# Patient Record
Sex: Female | Born: 1990
Health system: Southern US, Community
[De-identification: ages and names within clinical notes are randomized; demographics above are authoritative.]

## PROBLEM LIST (undated history)

## (undated) DIAGNOSIS — J45909 Unspecified asthma, uncomplicated: Secondary | ICD-10-CM

## (undated) DIAGNOSIS — Z8489 Family history of other specified conditions: Secondary | ICD-10-CM

## (undated) DIAGNOSIS — D649 Anemia, unspecified: Secondary | ICD-10-CM

## (undated) DIAGNOSIS — N390 Urinary tract infection, site not specified: Secondary | ICD-10-CM

## (undated) HISTORY — DX: Urinary tract infection, site not specified: N39.0

## (undated) HISTORY — PX: OTHER SURGICAL HISTORY: SHX169

## (undated) HISTORY — DX: Anemia, unspecified: D64.9

## (undated) HISTORY — DX: Unspecified asthma, uncomplicated: J45.909

---

## 2013-04-10 ENCOUNTER — Emergency Department (HOSPITAL_COMMUNITY)
Admission: EM | Admit: 2013-04-10 | Discharge: 2013-04-10 | Disposition: A | Discharge status: Home / Self Care | Payer: Self-pay | Attending: Emergency Medicine | Admitting: Emergency Medicine

## 2013-04-10 ENCOUNTER — Encounter (HOSPITAL_COMMUNITY): Payer: Self-pay | Admitting: Emergency Medicine

## 2013-04-10 DIAGNOSIS — T5791XA Toxic effect of unspecified inorganic substance, accidental (unintentional), initial encounter: Secondary | ICD-10-CM

## 2013-04-10 DIAGNOSIS — L272 Dermatitis due to ingested food: Secondary | ICD-10-CM | POA: Insufficient documentation

## 2013-04-10 NOTE — ED Provider Notes (Signed)
CSN: [id number]     Arrival date & time 04/10/13  1327 History  This chart was scribed for non-physician practitioner Antony Madura, PA-C working with Dagmar Hait, MD by Clydene Laming, ED Scribe. This patient was seen in room WTR7/WTR7 and the patient's care was started at 3:42 PM.   Chief Complaint  Patient presents with  . Allergic Reaction    The history is provided by the patient. No language interpreter was used.   HPI Comments: Rose Berry is a 22 y.o. female who presents to the Emergency Department complaining of an allergic reaction. Pt states she ate at [facility] on Saturday, 2 days ago, and haflway through the food she turned it over and found it was covered in mold. Pt reports associated pruritic rash the following day that has resolved spontaneously. She denies fever, swelling of the tongue, sob, inability to swallow, dysphagia, or drooling. Pt has barely eaten since Saturday but has tolerated water.   History reviewed. No pertinent past medical history. History reviewed. No pertinent past surgical history. No family history on file. History  Substance Use Topics  . Smoking status: Never Smoker   . Smokeless tobacco: Not on file  . Alcohol Use: Not on file   OB History   Grav Para Term Preterm Abortions TAB SAB Ect Mult Living                 Review of Systems  Constitutional: Negative for fever and chills.  HENT: Negative for drooling, facial swelling and trouble swallowing.   Skin: Positive for rash (reolved).  All other systems reviewed and are negative.    Allergies  Review of patient's allergies indicates no known allergies.  Home Medications  No current outpatient prescriptions on file. Triage Vitals:BP 106/83  Pulse 84  Temp(Src) 97.8 F (36.6 C) (Oral)  Resp 16  SpO2 100% Physical Exam  Nursing note and vitals reviewed. Constitutional: She is oriented to person, place, and time. She appears well-developed and well-nourished. No distress.   HENT:  Head: Normocephalic and atraumatic.  Mouth/Throat: Oropharynx is clear and moist. No oropharyngeal exudate.  Uvula midline and airway patent. Patient tolerating secretions without difficulty.  Eyes: Conjunctivae and EOM are normal. Pupils are equal, round, and reactive to light. No scleral icterus.  Neck: Normal range of motion. Neck supple.  Cardiovascular: Normal rate, regular rhythm and normal heart sounds.   Pulmonary/Chest: Effort normal and breath sounds normal. No stridor. No respiratory distress. She has no wheezes. She has no rales.  Musculoskeletal: Normal range of motion.  Neurological: She is alert and oriented to person, place, and time.  Skin: Skin is warm and dry. No rash noted. She is not diaphoretic. No erythema. No pallor.  Psychiatric: She has a normal mood and affect. Her behavior is normal.    ED Course  Procedures (including critical care time) DIAGNOSTIC STUDIES: Oxygen Saturation is 100% on RA, normal by my interpretation.    COORDINATION OF CARE: 3:50 PM- Discussed treatment plan with pt at bedside. Pt verbalized understanding and agreement with plan.   Labs Review Labs Reviewed - No data to display Imaging Review No results found.  EKG Interpretation   None       MDM   1. Ingestion of substance, initial encounter    Patient is an otherwise healthy 22 y.o female who presents after ingesting food with mold on it 2 days ago; patient concerned for allergic reaction because she developed a fleeting pruritic rash that has since  resolved without any intervention. Patient is well and nontoxic appearing, hemodynamically stable, and afebrile. She is tolerating secretions without difficulty and airway is patent; no stridor. No angioedema, shortness of breath or rashes appreciated. Do not believe further emergent work up is warranted. She is stable for d/c with PCP follow up. Benadryl and pepcid advised as needed should patient feel as though any  allergic-type reaction persists. Return precautions discussed and patient agreeable to plan with no unaddressed concerns.  I personally performed the services described in this documentation, which was scribed in my presence. The recorded information has been reviewed and is accurate.     Antony Madura, PA-C 04/19/13 1306

## 2013-04-10 NOTE — ED Notes (Signed)
Pt states she thinks she may have eaten mold on Saturday night; states started breaking out yesterday; itching

## 2013-04-10 NOTE — Progress Notes (Signed)
[facility] provided pt with a list of primary care resources and ACA information.  °

## 2013-04-19 NOTE — ED Provider Notes (Signed)
Medical screening examination/treatment/procedure(s) were performed by non-physician practitioner and as supervising physician I was immediately available for consultation/collaboration.  EKG Interpretation   None         William Neela Zecca, MD 04/19/13 1642 

## 2014-12-12 ENCOUNTER — Encounter: Payer: Self-pay | Admitting: Medical

## 2014-12-12 ENCOUNTER — Ambulatory Visit: Payer: Self-pay | Admitting: Medical

## 2014-12-12 ENCOUNTER — Ambulatory Visit (INDEPENDENT_AMBULATORY_CARE_PROVIDER_SITE_OTHER): Payer: Self-pay | Admitting: Medical

## 2014-12-12 ENCOUNTER — Telehealth: Payer: Self-pay | Admitting: Medical

## 2014-12-12 ENCOUNTER — Ambulatory Visit (HOSPITAL_BASED_OUTPATIENT_CLINIC_OR_DEPARTMENT_OTHER)
Admission: RE | Admit: 2014-12-12 | Discharge: 2014-12-12 | Disposition: A | Discharge status: Home / Self Care | Payer: Self-pay | Source: Ambulatory Visit | Attending: Medical | Admitting: Medical

## 2014-12-12 VITALS — BP 121/94 | HR 73 | Temp 98.9°F | Ht 62.75 in | Wt 162.2 lb

## 2014-12-12 DIAGNOSIS — R319 Hematuria, unspecified: Secondary | ICD-10-CM

## 2014-12-12 DIAGNOSIS — M549 Dorsalgia, unspecified: Secondary | ICD-10-CM

## 2014-12-12 DIAGNOSIS — N920 Excessive and frequent menstruation with regular cycle: Secondary | ICD-10-CM

## 2014-12-12 DIAGNOSIS — D649 Anemia, unspecified: Secondary | ICD-10-CM | POA: Insufficient documentation

## 2014-12-12 DIAGNOSIS — N390 Urinary tract infection, site not specified: Secondary | ICD-10-CM | POA: Insufficient documentation

## 2014-12-12 DIAGNOSIS — N3001 Acute cystitis with hematuria: Secondary | ICD-10-CM

## 2014-12-12 DIAGNOSIS — N132 Hydronephrosis with renal and ureteral calculous obstruction: Secondary | ICD-10-CM | POA: Insufficient documentation

## 2014-12-12 DIAGNOSIS — N201 Calculus of ureter: Secondary | ICD-10-CM | POA: Insufficient documentation

## 2014-12-12 LAB — CBC WITH DIFFERENTIAL/PLATELET
BASOS PCT: 0 % (ref 0–1)
Basophils Absolute: 0 10*3/uL (ref 0.0–0.1)
Eosinophils Absolute: 0.1 10*3/uL (ref 0.0–0.7)
Eosinophils Relative: 1 % (ref 0–5)
HEMATOCRIT: 31.1 % — AB (ref 36.0–46.0)
HEMOGLOBIN: 9.3 g/dL — AB (ref 12.0–15.0)
LYMPHS ABS: 2 10*3/uL (ref 0.7–4.0)
LYMPHS PCT: 29 % (ref 12–46)
MCH: 19.9 pg — AB (ref 26.0–34.0)
MCHC: 29.9 g/dL — AB (ref 30.0–36.0)
MCV: 66.6 fL — ABNORMAL LOW (ref 78.0–100.0)
MONO ABS: 0.5 10*3/uL (ref 0.1–1.0)
MONOS PCT: 7 % (ref 3–12)
MPV: 9.8 fL (ref 8.6–12.4)
NEUTROS ABS: 4.4 10*3/uL (ref 1.7–7.7)
NEUTROS PCT: 63 % (ref 43–77)
Platelets: 457 10*3/uL — ABNORMAL HIGH (ref 150–400)
RBC: 4.67 MIL/uL (ref 3.87–5.11)
RDW: 19.7 % — ABNORMAL HIGH (ref 11.5–15.5)
WBC: 7 10*3/uL (ref 4.0–10.5)

## 2014-12-12 LAB — COMPREHENSIVE METABOLIC PANEL
ALT: 10 U/L (ref 6–29)
AST: 15 U/L (ref 10–30)
Albumin: 4.3 g/dL (ref 3.6–5.1)
Alkaline Phosphatase: 53 U/L (ref 33–115)
BUN: 11 mg/dL (ref 7–25)
CALCIUM: 9.5 mg/dL (ref 8.6–10.2)
CHLORIDE: 102 mmol/L (ref 98–110)
CO2: 25 mmol/L (ref 20–31)
Creat: 1.02 mg/dL (ref 0.50–1.10)
GLUCOSE: 86 mg/dL (ref 65–99)
POTASSIUM: 4.6 mmol/L (ref 3.5–5.3)
Sodium: 137 mmol/L (ref 135–146)
Total Bilirubin: 0.5 mg/dL (ref 0.2–1.2)
Total Protein: 7.2 g/dL (ref 6.1–8.1)

## 2014-12-12 LAB — POCT URINALYSIS DIPSTICK
Bilirubin, UA: NEGATIVE
GLUCOSE UA: NEGATIVE
Ketones, UA: NEGATIVE
NITRITE UA: NEGATIVE
PROTEIN UA: NEGATIVE
RBC UA: POSITIVE
SPEC GRAV UA: 1.015
UROBILINOGEN UA: 0.2
pH, UA: 7

## 2014-12-12 LAB — POCT URINE PREGNANCY: Preg Test, Ur: NEGATIVE

## 2014-12-12 MED ORDER — KETOROLAC TROMETHAMINE 60 MG/2ML IM SOLN
60.0000 mg | Freq: Once | INTRAMUSCULAR | Status: AC
  Administered 2014-12-12: 60 mg via INTRAMUSCULAR

## 2014-12-12 MED ORDER — NITROFURANTOIN MONOHYD MACRO 100 MG PO CAPS: 100.0000 mg | ORAL_CAPSULE | Freq: Two times a day (BID) | ORAL | Status: DC

## 2014-12-12 MED ORDER — HYDROCODONE-ACETAMINOPHEN 10-325 MG PO TABS: 1.0000 | ORAL_TABLET | Freq: Four times a day (QID) | ORAL | Status: DC | PRN

## 2014-12-12 NOTE — Assessment & Plan Note (Signed)
For heavy menses which is liklely source of anemia, will refer to gyn so type of ocp can be given that you can tolerate.

## 2014-12-12 NOTE — Progress Notes (Deleted)
Subjective:     Patient ID: Rose Berry, female   DOB: 1990/10/23, 24 y.o.   MRN: [id number]  HPI   Review of Systems     Objective:   Physical Exam     Assessment:     ***    Plan:     ***

## 2014-12-12 NOTE — Assessment & Plan Note (Signed)
For possible uti associated stop keflex and start macrobid. Culture sent out today.

## 2014-12-12 NOTE — Patient Instructions (Addendum)
For anemia. Repeat cbc stat today. Continue Iron and refer to Gyn.  For your back pain and hematuria get ct renal stone protocol. Rx toradol 60 mg im. Rx hydrocodone.  For heavy menses which is liklely source of anemia, will refer to gyn so type of ocp can be given that you can tolerate.   For possible uti associated stop keflex and start macrobid. Culture sent out today.  Follow up on Monday upcoming week or as needed. But if pain or symptoms worsen during interim then ED evaluation.  [phone].  Pt wanted return to work note for tomorrow though I offered her to return on Friday.

## 2014-12-12 NOTE — Progress Notes (Signed)
Pre visit review using our clinic review tool, if applicable. No additional management support is needed unless otherwise documented below in the visit note. 

## 2014-12-12 NOTE — Progress Notes (Signed)
Subjective:    Patient ID: Rose Berry, female    DOB: 30-Oct-1990, 24 y.o.   MRN: [id number]  HPI  I have reviewed pt PMH, PSH, FH, Social History and Surgical History  Anemia- Pt states was at [facility] ED. Hb was 7.7 and hct 26.1. Pt has history of heavy menses. Pt last menstrual cycle started a week ago Monday and ended on past Friday. Pt menstrual cycle just one time a month but very heavy. Pt was given iron for her anemia. Pt states in past she tried ocp. She did not like/could not tolerate nausea and side effects from.   Pt left side back pain(can't find comfortable position). That started last Wednesday. Pain woke her out of sleep. Pt has no pain when she urinates. No dysuria. Pt has had fevers and some chills. Pt never had any kidney stones. Pt had xray of abdomen but no CT in ED per her report.. Pt given keflex for possible infection. Also given hydrocodone for pain but she is still in pain.  Asthma- hx of but under control.  Dr.Hayes former pcp.          Review of Systems  Constitutional: Positive for fever, chills and fatigue. Negative for diaphoresis.  Respiratory: Negative for cough, chest tightness, shortness of breath and wheezing.   Cardiovascular: Negative for chest pain and palpitations.  Gastrointestinal: Negative for nausea, vomiting, abdominal pain, diarrhea and constipation.  Genitourinary: Negative for dysuria, urgency, frequency, hematuria and pelvic pain.  Musculoskeletal: Positive for back pain.  Neurological: Negative for dizziness and headaches.  Hematological: Negative for adenopathy. Does not bruise/bleed easily.  Psychiatric/Behavioral: Negative for behavioral problems and confusion.   Past Medical History  Diagnosis Date  . Anemia   . Asthma   . UTI (urinary tract infection)     Social History   Social History  . Marital Status: Single    Spouse Name: N/A  . Number of Children: N/A  . Years of Education: N/A    Occupational History  . Not on file.   Social History Main Topics  . Smoking status: Never Smoker   . Smokeless tobacco: Not on file  . Alcohol Use: No  . Drug Use: No  . Sexual Activity: Not on file   Other Topics Concern  . Not on file   Social History Narrative    History reviewed. No pertinent past surgical history.  Family History  Problem Relation Age of Onset  . Arthritis Mother   . Hypertension Mother   . Arthritis Father   . Hyperlipidemia Maternal Grandmother   . Hypertension Maternal Grandmother   . Diabetes Maternal Grandmother   . Kidney disease Maternal Grandmother   . Hyperlipidemia Maternal Grandfather   . Hypertension Maternal Grandfather   . Diabetes Maternal Grandfather   . Kidney disease Maternal Grandfather   . Hyperlipidemia Paternal Grandmother   . Hypertension Paternal Grandmother   . Diabetes Paternal Grandmother   . Kidney disease Paternal Grandmother   . Hyperlipidemia Paternal Grandfather   . Hypertension Paternal Grandfather   . Diabetes Paternal Grandfather   . Kidney disease Paternal Grandfather     No Known Allergies  No current outpatient prescriptions on file prior to visit.   No current facility-administered medications on file prior to visit.    BP 121/94 mmHg  Pulse 73  Temp(Src) 98.9 F (37.2 C) (Oral)  Ht 5' 2.75" (1.594 m)  Wt 162 lb 3.2 oz (73.573 kg)  BMI 28.96 kg/m2  SpO2  100%       Objective:   Physical Exam  General Appearance- Not in acute distress but appears to be in pain grabbing left cva area and difficulty finding comfortable postion.  HEENT Eyes- Scleraeral/Conjuntiva-bilat- Not Yellow. Mouth & Throat- Normal.  Chest and Lung Exam Auscultation: Breath sounds:-Normal. Adventitious sounds:- No Adventitious sounds.  Cardiovascular Auscultation:Rythm - Regular. Heart Sounds -Normal heart sounds.  Abdomen Inspection:-Inspection Normal.  Palpation/Perucssion: Palpation and Percussion of  the abdomen reveal- Non Tender, No Rebound tenderness, No rigidity(Guarding) and No Palpable abdominal masses.  Liver:-Normal.  Spleen:- Normal.   Back- lt cva area/lower rib region/mid axillary region pain at that level.      Assessment & Plan:

## 2014-12-12 NOTE — Assessment & Plan Note (Signed)
For anemia. Repeat cbc stat today. Continue Iron and refer to Gyn.

## 2014-12-12 NOTE — Assessment & Plan Note (Signed)
For your back pain and hematuria get ct renal stone protocol. Rx toradol 60 mg im. Rx hydrocodone.

## 2014-12-13 LAB — URINE CULTURE
COLONY COUNT: NO GROWTH
ORGANISM ID, BACTERIA: NO GROWTH

## 2014-12-13 NOTE — Addendum Note (Signed)
Addended by: Anabel Halon on: 12/13/2014 08:08 AM   Modules accepted: Orders

## 2014-12-14 ENCOUNTER — Encounter: Payer: Self-pay | Admitting: Medical

## 2014-12-14 ENCOUNTER — Ambulatory Visit (INDEPENDENT_AMBULATORY_CARE_PROVIDER_SITE_OTHER): Payer: Self-pay | Admitting: Medical

## 2014-12-14 VITALS — BP 126/86 | HR 89 | Temp 98.3°F | Resp 16 | Wt 162.6 lb

## 2014-12-14 DIAGNOSIS — N200 Calculus of kidney: Secondary | ICD-10-CM

## 2014-12-14 DIAGNOSIS — D649 Anemia, unspecified: Secondary | ICD-10-CM

## 2014-12-14 MED ORDER — KETOROLAC TROMETHAMINE 60 MG/2ML IM SOLN
60.0000 mg | Freq: Once | INTRAMUSCULAR | Status: AC
  Administered 2014-12-14: 60 mg via INTRAMUSCULAR

## 2014-12-14 MED ORDER — OXYCODONE-ACETAMINOPHEN 5-325 MG PO TABS: 1.0000 | ORAL_TABLET | Freq: Three times a day (TID) | ORAL | Status: DC | PRN

## 2014-12-14 MED ORDER — KETOROLAC TROMETHAMINE 60 MG/2ML IM SOLN: 60.0000 mg | Freq: Once | INTRAMUSCULAR | Status: DC

## 2014-12-14 NOTE — Telephone Encounter (Signed)
Pt was no show 12/12/14 9:30am, new pt appt, pt came in late because she got lost, came back same day at 3:30pm, charge for no show?

## 2014-12-14 NOTE — Progress Notes (Signed)
Pre visit review using our clinic review tool, if applicable. No additional management support is needed unless otherwise documented below in the visit note. 

## 2014-12-14 NOTE — Patient Instructions (Addendum)
For your kidney stone we gave toradol 60mg  im today. Stop hydrocodone. Start oxycodone.   Use oxycodone only. If over the weekend pain not controlled or worsening symptom then ED evaluation.  Do not use hydrocodone and oxcycodone at same time. Very important advisement.  Will refer pt to urologist. Try to get her in by this coming Monday.  Follow up with [place] as needed early next week.  Pt does not want to go to urologist today since would cost $250 up front. So they opted to wait and see if stone may pass by this Monday. Urologist office will schedule her with on call urologist on Monday if needed. They will call her on Monday am. During the interim if pain worsens pt and mom agreed would go to ED.

## 2014-12-14 NOTE — Telephone Encounter (Signed)
No charge. 

## 2014-12-14 NOTE — Progress Notes (Signed)
Subjective:    Patient ID: Rose Berry, female    DOB: April 05, 1991, 24 y.o.   MRN: [id number]  HPI   4 mm left side cva/flank area pain. Pt still has pain medication. I prescribed her hydrocodone 10/325mg . She has severe level pain despite [place] of pain medication. Pt states vomited on time today  with high level pain.  Pt UC was negative.   Pt anemia level improved. She was placed on iron in the ED. When I rechecked hb/hct increased. Infection fighting cells were not elevated.  Pt states toradol Im given the other day gave her relief until yesterday. When pain returned.  Pt lmp last Monday.     Review of Systems  Constitutional: Negative for fever, chills and fatigue.  Respiratory: Negative for cough, chest tightness, shortness of breath and wheezing.   Cardiovascular: Negative for chest pain and palpitations.  Gastrointestinal: Negative for abdominal pain.  Musculoskeletal: Positive for back pain.       Back pain with lt flank pain.  Neurological: Negative for dizziness and headaches.  Hematological: Negative for adenopathy. Does not bruise/bleed easily.  Psychiatric/Behavioral: Negative for behavioral problems and confusion.    Past Medical History  Diagnosis Date  . Anemia   . Asthma   . UTI (urinary tract infection)     Social History   Social History  . Marital Status: Single    Spouse Name: N/A  . Number of Children: N/A  . Years of Education: N/A   Occupational History  . Not on file.   Social History Main Topics  . Smoking status: Never Smoker   . Smokeless tobacco: Not on file  . Alcohol Use: No  . Drug Use: No  . Sexual Activity: Not on file   Other Topics Concern  . Not on file   Social History Narrative    No past surgical history on file.  Family History  Problem Relation Age of Onset  . Arthritis Mother   . Hypertension Mother   . Arthritis Father   . Hyperlipidemia Maternal Grandmother   . Hypertension Maternal Grandmother     . Diabetes Maternal Grandmother   . Kidney disease Maternal Grandmother   . Hyperlipidemia Maternal Grandfather   . Hypertension Maternal Grandfather   . Diabetes Maternal Grandfather   . Kidney disease Maternal Grandfather   . Hyperlipidemia Paternal Grandmother   . Hypertension Paternal Grandmother   . Diabetes Paternal Grandmother   . Kidney disease Paternal Grandmother   . Hyperlipidemia Paternal Grandfather   . Hypertension Paternal Grandfather   . Diabetes Paternal Grandfather   . Kidney disease Paternal Grandfather     No Known Allergies  Current Outpatient Prescriptions on File Prior to Visit  Medication Sig Dispense Refill  . ferrous sulfate 325 (65 FE) MG tablet Take 325 mg by mouth 2 (two) times daily.    Marland Kitchen HYDROcodone-acetaminophen (NORCO) 10-325 MG per tablet Take 1 tablet by mouth every 6 (six) hours as needed. 30 tablet 0  . nitrofurantoin, macrocrystal-monohydrate, (MACROBID) 100 MG capsule Take 1 capsule (100 mg total) by mouth 2 (two) times daily. 14 capsule 0  . promethazine (PHENERGAN) 25 MG tablet Take 25 mg by mouth every 6 (six) hours as needed for nausea or vomiting.     No current facility-administered medications on file prior to visit.    BP 126/86 mmHg  Pulse 89  Temp(Src) 98.3 F (36.8 C) (Oral)  Resp 16  Wt 162 lb 9.6 oz (73.755 kg)  SpO2  98%       Objective:   Physical Exam  General- No acute distress. Pleasant patient. Neck- Full range of motion, no jvd Lungs- Clear, even and unlabored. Heart- regular rate and rhythm. Neurologic- CNII- XII grossly intact.  Back- lt cva tenderness with lt flank tenderness  Abdomen Inspection:-Inspection Normal.  Palpation/Perucssion: Palpation and Percussion of the abdomen reveal- Non Tender, No Rebound tenderness, No rigidity(Guarding) and No Palpable abdominal masses.  Liver:-Normal.  Spleen:- Normal.    .     Assessment & Plan:  For your kidney stone we gave toradol 60mg  im today. Stop  hydrocodone. Start oxycodone.   Use oxycodone only. If over the weekend pain not controlled or worsening symptom then ED evaluation.  Will refer pt to urologist. Try to get her in by this coming Monday.  Follow up with [place] as needed early next week.

## 2014-12-17 ENCOUNTER — Ambulatory Visit: Payer: Self-pay | Admitting: Medical

## 2014-12-19 ENCOUNTER — Telehealth: Payer: Self-pay | Admitting: Medical

## 2014-12-19 DIAGNOSIS — D649 Anemia, unspecified: Secondary | ICD-10-CM

## 2014-12-19 NOTE — Telephone Encounter (Signed)
How is pt doing. Did she ever see urologist for kidney stone?? Also wanted her to come by lab and repeat cbc to see if her anemia had improved. She does not need to see me to get cbc. Could go to lab. I can see her if needed.

## 2014-12-20 NOTE — Addendum Note (Signed)
Addended by: Tasia Catchings on: 12/20/2014 09:46 AM   Modules accepted: Orders

## 2014-12-20 NOTE — Telephone Encounter (Signed)
Spoke with pt and she states she was feeling some better. She did not go to the urologist due to financial reasons. She states she will follow up with them if needed. Pt has future lab appt tomorrow morning for a repeat cbc.

## 2014-12-21 ENCOUNTER — Other Ambulatory Visit (INDEPENDENT_AMBULATORY_CARE_PROVIDER_SITE_OTHER): Payer: Self-pay

## 2014-12-21 DIAGNOSIS — D649 Anemia, unspecified: Secondary | ICD-10-CM

## 2014-12-21 LAB — CBC WITH DIFFERENTIAL/PLATELET
BASOS ABS: 0 10*3/uL (ref 0.0–0.1)
Basophils Relative: 0.6 % (ref 0.0–3.0)
EOS PCT: 2.3 % (ref 0.0–5.0)
Eosinophils Absolute: 0.1 10*3/uL (ref 0.0–0.7)
HEMATOCRIT: 31 % — AB (ref 36.0–46.0)
HEMOGLOBIN: 9.6 g/dL — AB (ref 12.0–15.0)
LYMPHS ABS: 1.5 10*3/uL (ref 0.7–4.0)
Lymphocytes Relative: 27.5 % (ref 12.0–46.0)
MCHC: 31.2 g/dL (ref 30.0–36.0)
MCV: 67.7 fl — ABNORMAL LOW (ref 78.0–100.0)
MONO ABS: 0.4 10*3/uL (ref 0.1–1.0)
MONOS PCT: 7.7 % (ref 3.0–12.0)
NEUTROS ABS: 3.3 10*3/uL (ref 1.4–7.7)
Neutrophils Relative %: 61.9 % (ref 43.0–77.0)
Platelets: 342 10*3/uL (ref 150.0–400.0)
RBC: 4.58 Mil/uL (ref 3.87–5.11)
RDW: 23.8 % — AB (ref 11.5–15.5)
WBC: 5.4 10*3/uL (ref 4.0–10.5)

## 2014-12-24 NOTE — Addendum Note (Signed)
Addended by: Tasia Catchings on: 12/24/2014 09:26 AM   Modules accepted: Orders

## 2015-01-07 ENCOUNTER — Ambulatory Visit (INDEPENDENT_AMBULATORY_CARE_PROVIDER_SITE_OTHER): Payer: Self-pay | Admitting: Obstetrics & Gynecology

## 2015-01-07 ENCOUNTER — Encounter: Payer: Self-pay | Admitting: Obstetrics & Gynecology

## 2015-01-07 ENCOUNTER — Other Ambulatory Visit (INDEPENDENT_AMBULATORY_CARE_PROVIDER_SITE_OTHER): Payer: Self-pay

## 2015-01-07 VITALS — BP 124/88 | HR 71 | Ht 62.0 in | Wt 158.0 lb

## 2015-01-07 DIAGNOSIS — Z124 Encounter for screening for malignant neoplasm of cervix: Secondary | ICD-10-CM

## 2015-01-07 DIAGNOSIS — N920 Excessive and frequent menstruation with regular cycle: Secondary | ICD-10-CM

## 2015-01-07 DIAGNOSIS — D649 Anemia, unspecified: Secondary | ICD-10-CM

## 2015-01-07 DIAGNOSIS — Z113 Encounter for screening for infections with a predominantly sexual mode of transmission: Secondary | ICD-10-CM

## 2015-01-07 DIAGNOSIS — Z0000 Encounter for general adult medical examination without abnormal findings: Secondary | ICD-10-CM

## 2015-01-07 DIAGNOSIS — D500 Iron deficiency anemia secondary to blood loss (chronic): Secondary | ICD-10-CM

## 2015-01-07 DIAGNOSIS — Z30011 Encounter for initial prescription of contraceptive pills: Secondary | ICD-10-CM

## 2015-01-07 LAB — CBC WITH DIFFERENTIAL/PLATELET
BASOS ABS: 0 10*3/uL (ref 0.0–0.1)
BASOS PCT: 0.6 % (ref 0.0–3.0)
EOS PCT: 2.2 % (ref 0.0–5.0)
Eosinophils Absolute: 0.1 10*3/uL (ref 0.0–0.7)
HCT: 31.5 % — ABNORMAL LOW (ref 36.0–46.0)
Hemoglobin: 9.8 g/dL — ABNORMAL LOW (ref 12.0–15.0)
LYMPHS ABS: 1.6 10*3/uL (ref 0.7–4.0)
Lymphocytes Relative: 36.3 % (ref 12.0–46.0)
MCHC: 31.2 g/dL (ref 30.0–36.0)
MONOS PCT: 8.3 % (ref 3.0–12.0)
Monocytes Absolute: 0.4 10*3/uL (ref 0.1–1.0)
NEUTROS ABS: 2.4 10*3/uL (ref 1.4–7.7)
NEUTROS PCT: 52.6 % (ref 43.0–77.0)
PLATELETS: 342 10*3/uL (ref 150.0–400.0)
RBC: 4.68 Mil/uL (ref 3.87–5.11)
RDW: 23 % — AB (ref 11.5–15.5)
WBC: 4.5 10*3/uL (ref 4.0–10.5)

## 2015-01-07 MED ORDER — NORGESTREL-ETHINYL ESTRADIOL 0.3-30 MG-MCG PO TABS: 1.0000 | ORAL_TABLET | Freq: Every day | ORAL | Status: DC

## 2015-01-07 NOTE — Addendum Note (Signed)
Addended by: Phill Myron on: 01/07/2015 09:45 AM   Modules accepted: Orders

## 2015-01-07 NOTE — Progress Notes (Signed)
   Subjective:    Patient ID: Rose Berry, female    DOB: [birth year], 24 y.o.   MRN: [id number]  HPI  24 yo S AA G0 here with menorrhagia. She started her period at age 37 and has had heavy 5 day painful periods for the last 10 years. She uses condoms for contraception. She has been monogamous for about a year. She has a 9.3 hemoglobin.  Review of Systems Pap smear is due.    Objective:   Physical Exam [id number] Breathing, conversing, and ambulating normally Abd- benign EG, vagina, cervix- normal Bimanual exam-       Assessment & Plan:  Menorrhagia Dysmenorrhea Check TSH, [place], gyn u/s Preventative- check pap with cultures, offer Gardasil and flu vaccine. Trial of lo ovral with NMP RTC 2 weeks

## 2015-01-09 LAB — CYTOLOGY - PAP

## 2015-01-10 ENCOUNTER — Telehealth: Payer: Self-pay

## 2015-01-10 MED ORDER — METRONIDAZOLE 500 MG PO TABS
ORAL_TABLET | ORAL | Status: DC
Start: 2015-01-10 — End: 2016-01-16

## 2015-01-10 NOTE — Telephone Encounter (Signed)
-----   Message from Emily Filbert, MD sent at 01/10/2015 10:53 AM EDT ----- Can you please call in flagyl 2 grams for a one time dose. I already called the patient and gave her results and told her about her boyfriend getting treated. Thanks

## 2015-01-21 ENCOUNTER — Ambulatory Visit: Payer: Self-pay | Admitting: Obstetrics & Gynecology

## 2015-01-28 ENCOUNTER — Ambulatory Visit: Payer: Self-pay | Admitting: Obstetrics & Gynecology

## 2015-02-18 ENCOUNTER — Ambulatory Visit: Payer: Self-pay | Admitting: Obstetrics & Gynecology

## 2015-07-09 ENCOUNTER — Telehealth: Payer: Self-pay | Admitting: Medical

## 2015-07-09 NOTE — Telephone Encounter (Signed)
LVM inquiring if patient received flu shot  °

## 2016-01-16 ENCOUNTER — Ambulatory Visit (HOSPITAL_BASED_OUTPATIENT_CLINIC_OR_DEPARTMENT_OTHER)
Admission: RE | Admit: 2016-01-16 | Discharge: 2016-01-16 | Disposition: A | Discharge status: Home / Self Care | Payer: Self-pay | Source: Ambulatory Visit | Attending: Medical | Admitting: Medical

## 2016-01-16 ENCOUNTER — Telehealth: Payer: Self-pay | Admitting: Medical

## 2016-01-16 ENCOUNTER — Other Ambulatory Visit: Payer: Self-pay | Admitting: Medical

## 2016-01-16 ENCOUNTER — Encounter: Payer: Self-pay | Admitting: Medical

## 2016-01-16 ENCOUNTER — Ambulatory Visit (INDEPENDENT_AMBULATORY_CARE_PROVIDER_SITE_OTHER): Payer: Self-pay | Admitting: Medical

## 2016-01-16 VITALS — BP 129/75 | HR 106 | Temp 98.8°F | Ht 62.0 in | Wt 154.6 lb

## 2016-01-16 DIAGNOSIS — D259 Leiomyoma of uterus, unspecified: Secondary | ICD-10-CM

## 2016-01-16 DIAGNOSIS — R0781 Pleurodynia: Secondary | ICD-10-CM | POA: Insufficient documentation

## 2016-01-16 DIAGNOSIS — R7989 Other specified abnormal findings of blood chemistry: Secondary | ICD-10-CM

## 2016-01-16 DIAGNOSIS — M940 Chondrocostal junction syndrome [Tietze]: Secondary | ICD-10-CM

## 2016-01-16 DIAGNOSIS — N920 Excessive and frequent menstruation with regular cycle: Secondary | ICD-10-CM

## 2016-01-16 DIAGNOSIS — R0789 Other chest pain: Secondary | ICD-10-CM

## 2016-01-16 DIAGNOSIS — R0600 Dyspnea, unspecified: Secondary | ICD-10-CM | POA: Insufficient documentation

## 2016-01-16 DIAGNOSIS — D649 Anemia, unspecified: Secondary | ICD-10-CM

## 2016-01-16 DIAGNOSIS — N83201 Unspecified ovarian cyst, right side: Secondary | ICD-10-CM

## 2016-01-16 DIAGNOSIS — R5383 Other fatigue: Secondary | ICD-10-CM

## 2016-01-16 LAB — CBC WITH DIFFERENTIAL/PLATELET
BASOS ABS: 0 {cells}/uL (ref 0–200)
Basophils Relative: 0 %
EOS ABS: 0 {cells}/uL — AB (ref 15–500)
Eosinophils Relative: 0 %
HEMATOCRIT: 31.4 % — AB (ref 35.0–45.0)
Hemoglobin: 9.4 g/dL — ABNORMAL LOW (ref 11.7–15.5)
LYMPHS PCT: 28 %
Lymphs Abs: 1820 cells/uL (ref 850–3900)
MCH: 19.3 pg — AB (ref 27.0–33.0)
MCHC: 29.9 g/dL — ABNORMAL LOW (ref 32.0–36.0)
MCV: 64.6 fL — AB (ref 80.0–100.0)
MONO ABS: 520 {cells}/uL (ref 200–950)
MONOS PCT: 8 %
NEUTROS PCT: 64 %
Neutro Abs: 4160 cells/uL (ref 1500–7800)
PLATELETS: 395 10*3/uL (ref 140–400)
RBC: 4.86 MIL/uL (ref 3.80–5.10)
RDW: 21.6 % — AB (ref 11.0–15.0)
WBC: 6.5 10*3/uL (ref 3.8–10.8)

## 2016-01-16 LAB — POCT URINE PREGNANCY: PREG TEST UR: NEGATIVE

## 2016-01-16 MED ORDER — ALBUTEROL SULFATE HFA 108 (90 BASE) MCG/ACT IN AERS: 2.0000 | INHALATION_SPRAY | Freq: Four times a day (QID) | RESPIRATORY_TRACT | 0 refills | Status: DC | PRN

## 2016-01-16 NOTE — Patient Instructions (Addendum)
For your fatigue and history of anemia will get cbc, iron studies, cmp and tsh. May refill you iron after labs back. Consider refer back to gyn or rx ocp  For your chest pain we did ekg which showed normal rhythm. Also decided to get chest xray. I want you to take motrin otc for possibe pleuritic pain or costochondritis pain. Will rx albuterol for sob or wheezing. May give antibiotic if xray shows infection or if wbc increased.  Follow up in 7 days or as needed

## 2016-01-16 NOTE — Telephone Encounter (Signed)
Will you work on [place] studies I placed yesterday. Thanks.

## 2016-01-16 NOTE — Progress Notes (Signed)
Subjective:    Patient ID: Rose Berry, female    DOB: 20-Mar-1991, 25 y.o.   MRN: [id number]  HPI  Pt in states she has been fatigued. Pt has history of anemia since 12-21-2015. Last 2-3 weeks has gotten fatigued moderate. Today felt little dizzy transiently. But no cardiac or neuologic signs or symptoms today.  In past I had put her on iron tablets 3 tablets a day. But she ran out. I had referred her to gynecologist due to heavy menses. She did go to the appointment. They were going to do [place] to see if she has fibroids. She did not go to that appointment for [place]. She states first 2 days of her cycles area still heavy. Total days on menses is 5 days. Pregnancy test is negative. No black stools. No abdomen pain.  LMP- 3 wks ago. Came when she expected it to.   Today some chest pain when she breaths deep last 2 days(last for second then subsides). No pain at rest.  Not on movement. No leg pain. No trauma.  Pt at times feels sob when walking. Occurs for 2 months. No wheezing. No fever, no chills or sweats over past 2 months. No leg pain. No popliteal pain.  Today when walking in office at work was sweating. But not in our offie.    Review of Systems  Constitutional: Positive for diaphoresis. Negative for chills and fatigue.       See hpi.  Respiratory: Negative for cough, chest tightness, shortness of breath and wheezing.   Gastrointestinal: Negative for abdominal pain.  Musculoskeletal: Negative for back pain.       See hpi.  Skin: Negative for rash.  Neurological: Negative for dizziness, syncope, speech difficulty, weakness, light-headedness and headaches.       Earlier in day transient dizzy for seconds but none on exam.  Hematological: Negative for adenopathy. Does not bruise/bleed easily.  Psychiatric/Behavioral: Negative for confusion.    Past Medical History:  Diagnosis Date  . Anemia   . Asthma   . UTI (urinary tract infection)      Social History   Social History   . Marital status: Single    Spouse name: N/A  . Number of children: N/A  . Years of education: N/A   Occupational History  . Not on file.   Social History Main Topics  . Smoking status: Never Smoker  . Smokeless tobacco: Not on file  . Alcohol use No  . Drug use: No  . Sexual activity: Yes    Birth control/ protection: Condom   Other Topics Concern  . Not on file   Social History Narrative  . No narrative on file    No past surgical history on file.  Family History  Problem Relation Age of Onset  . Arthritis Mother   . Hypertension Mother   . Hyperlipidemia Maternal Grandmother   . Hypertension Maternal Grandmother   . Diabetes Maternal Grandmother   . Kidney disease Maternal Grandmother   . Hyperlipidemia Maternal Grandfather   . Hypertension Maternal Grandfather   . Diabetes Maternal Grandfather   . Kidney disease Maternal Grandfather   . Hyperlipidemia Paternal Grandmother   . Hypertension Paternal Grandmother   . Diabetes Paternal Grandmother   . Kidney disease Paternal Grandmother   . Hyperlipidemia Paternal Grandfather   . Hypertension Paternal Grandfather   . Diabetes Paternal Grandfather   . Kidney disease Paternal Grandfather     No Known Allergies  Current Outpatient Prescriptions  on File Prior to Visit  Medication Sig Dispense Refill  . ferrous sulfate 325 (65 FE) MG tablet Take 325 mg by mouth 2 (two) times daily.     No current facility-administered medications on file prior to visit.     BP 129/75   Pulse (!) 106   Temp 98.8 F (37.1 C) (Oral)   Ht 5\' 2"  (1.575 m)   Wt 154 lb 9.6 oz (70.1 kg)   LMP 12/26/2015   SpO2 100%   BMI 28.28 kg/m       Objective:   Physical Exam   General Mental Status- Alert. General Appearance- Not in acute distress.   Skin General: Color- Normal Color. Moisture- Normal Moisture.  Neck Carotid Arteries- Normal color. Moisture- Normal Moisture. No carotid bruits. No JVD.  Chest and Lung  Exam Auscultation: Breath Sounds:-Normal.  Cardiovascular Auscultation:Rythm- Regular. Murmurs & Other Heart Sounds:Auscultation of the heart reveals- No Murmurs.  Abdomen Inspection:-Inspeection Normal. Palpation/Percussion:Note:No mass. Palpation and Percussion of the abdomen reveal- Non Tender, Non Distended + BS, no rebound or guarding.    Neurologic Cranial Nerve exam:- CN III-XII intact(No nystagmus), symmetric smile. Strength:- 5/5 equal and symmetric strength both upper and lower extremities.  Anterior thorax- on palpation no tenderness to palpation. Pain on respiration costochondral but only briefly for second or so.  Lower ext- no pedal edema, Calfs symmetric and negative homans signs.       Assessment & Plan:  ekg sinus rhythm. Minimal tachycardia 120. ekg before was 96.(repeat since v6 did not show on first ekg.   For your fatigue and history of anemia will get cbc, iron studies, cmp and tsh. May refill you iron after labs back. Consider refer back to gyn or rx ocp  For your chest pain we did ekg which showed normal rhythm. Also decided to get chest xray. I want you to take motrin otc for possibe pleuritic pain or constochondritis pain. Will rx albuterol for sob or wheezing. May give antibiotic if xray shows infection or if wbc increased.  Follow up in 7 days or as needed  Tavaras Goody, Percell Miller, [employer]

## 2016-01-17 ENCOUNTER — Other Ambulatory Visit (HOSPITAL_BASED_OUTPATIENT_CLINIC_OR_DEPARTMENT_OTHER): Payer: Self-pay

## 2016-01-17 LAB — COMPREHENSIVE METABOLIC PANEL
ALBUMIN: 3.8 g/dL (ref 3.5–5.2)
ALK PHOS: 49 U/L (ref 39–117)
ALT: 20 U/L (ref 0–35)
AST: 20 U/L (ref 0–37)
BILIRUBIN TOTAL: 0.7 mg/dL (ref 0.2–1.2)
BUN: 14 mg/dL (ref 6–23)
CO2: 27 mEq/L (ref 19–32)
CREATININE: 0.65 mg/dL (ref 0.40–1.20)
Calcium: 9.3 mg/dL (ref 8.4–10.5)
Chloride: 107 mEq/L (ref 96–112)
GFR: 140.98 mL/min (ref >60.00)
GLUCOSE: 97 mg/dL (ref 70–99)
POTASSIUM: 4.5 meq/L (ref 3.5–5.1)
SODIUM: 140 meq/L (ref 135–145)
TOTAL PROTEIN: 7.1 g/dL (ref 6.0–8.3)

## 2016-01-17 LAB — IRON AND TIBC
%SAT: 3 % — ABNORMAL LOW (ref 11–50)
Iron: 11 ug/dL — ABNORMAL LOW (ref 40–190)
TIBC: 376 ug/dL (ref 250–450)
UIBC: 365 ug/dL (ref 125–400)

## 2016-01-17 LAB — TSH

## 2016-01-17 LAB — FERRITIN: Ferritin: 3.2 ng/mL — ABNORMAL LOW (ref 10.0–291.0)

## 2016-01-17 MED ORDER — FERROUS SULFATE 325 (65 FE) MG PO TABS: 325.0000 mg | ORAL_TABLET | Freq: Two times a day (BID) | ORAL | 3 refills | Status: DC

## 2016-01-17 NOTE — Telephone Encounter (Signed)
Pt is scheduled to come in 01/18/16

## 2016-01-18 ENCOUNTER — Ambulatory Visit (HOSPITAL_BASED_OUTPATIENT_CLINIC_OR_DEPARTMENT_OTHER)
Admission: RE | Admit: 2016-01-18 | Discharge: 2016-01-18 | Disposition: A | Discharge status: Home / Self Care | Payer: Self-pay | Source: Ambulatory Visit | Attending: Medical | Admitting: Medical

## 2016-01-18 ENCOUNTER — Ambulatory Visit (HOSPITAL_BASED_OUTPATIENT_CLINIC_OR_DEPARTMENT_OTHER): Payer: Self-pay

## 2016-01-18 DIAGNOSIS — N920 Excessive and frequent menstruation with regular cycle: Secondary | ICD-10-CM | POA: Insufficient documentation

## 2016-01-18 DIAGNOSIS — D251 Intramural leiomyoma of uterus: Secondary | ICD-10-CM | POA: Insufficient documentation

## 2016-01-18 DIAGNOSIS — N83201 Unspecified ovarian cyst, right side: Secondary | ICD-10-CM | POA: Insufficient documentation

## 2016-01-18 NOTE — Telephone Encounter (Signed)
Referal to gyn placed.

## 2016-02-12 ENCOUNTER — Encounter: Payer: Self-pay | Admitting: Obstetrics & Gynecology

## 2016-03-03 ENCOUNTER — Encounter: Payer: Self-pay | Admitting: Family

## 2016-03-03 ENCOUNTER — Other Ambulatory Visit (HOSPITAL_COMMUNITY)
Admission: RE | Admit: 2016-03-03 | Discharge: 2016-03-03 | Disposition: A | Discharge status: Home / Self Care | Payer: Self-pay | Source: Ambulatory Visit | Attending: Family | Admitting: Family

## 2016-03-03 ENCOUNTER — Ambulatory Visit (INDEPENDENT_AMBULATORY_CARE_PROVIDER_SITE_OTHER): Payer: Self-pay | Admitting: Family

## 2016-03-03 VITALS — BP 134/76 | HR 98 | Temp 98.4°F | Resp 16 | Ht 62.0 in | Wt 151.6 lb

## 2016-03-03 DIAGNOSIS — R102 Pelvic and perineal pain: Secondary | ICD-10-CM

## 2016-03-03 DIAGNOSIS — R109 Unspecified abdominal pain: Secondary | ICD-10-CM | POA: Insufficient documentation

## 2016-03-03 LAB — CBC WITH DIFFERENTIAL/PLATELET
BASOS ABS: 0 10*3/uL (ref 0.0–0.1)
Basophils Relative: 0.3 % (ref 0.0–3.0)
Eosinophils Absolute: 0 10*3/uL (ref 0.0–0.7)
Eosinophils Relative: 0.4 % (ref 0.0–5.0)
HCT: 33.9 % — ABNORMAL LOW (ref 36.0–46.0)
Hemoglobin: 10.5 g/dL — ABNORMAL LOW (ref 12.0–15.0)
LYMPHS ABS: 1.8 10*3/uL (ref 0.7–4.0)
Lymphocytes Relative: 24.7 % (ref 12.0–46.0)
MCHC: 31.1 g/dL (ref 30.0–36.0)
MCV: 63.8 fl — ABNORMAL LOW (ref 78.0–100.0)
MONO ABS: 0.6 10*3/uL (ref 0.1–1.0)
Monocytes Relative: 8.6 % (ref 3.0–12.0)
NEUTROS PCT: 66 % (ref 43.0–77.0)
Neutro Abs: 4.7 10*3/uL (ref 1.4–7.7)
PLATELETS: 426 10*3/uL — AB (ref 150.0–400.0)
RBC: 5.31 Mil/uL — AB (ref 3.87–5.11)
RDW: 19.4 % — ABNORMAL HIGH (ref 11.5–15.5)
WBC: 7.2 10*3/uL (ref 4.0–10.5)

## 2016-03-03 LAB — COMPREHENSIVE METABOLIC PANEL
ALK PHOS: 60 U/L (ref 39–117)
ALT: 17 U/L (ref 0–35)
AST: 18 U/L (ref 0–37)
Albumin: 4.3 g/dL (ref 3.5–5.2)
BUN: 8 mg/dL (ref 6–23)
CO2: 25 meq/L (ref 19–32)
Calcium: 10 mg/dL (ref 8.4–10.5)
Chloride: 105 mEq/L (ref 96–112)
Creatinine, Ser: 0.66 mg/dL (ref 0.40–1.20)
GFR: 138.39 mL/min (ref >60.00)
GLUCOSE: 94 mg/dL (ref 70–99)
POTASSIUM: 4.3 meq/L (ref 3.5–5.1)
SODIUM: 137 meq/L (ref 135–145)
TOTAL PROTEIN: 7.8 g/dL (ref 6.0–8.3)
Total Bilirubin: 0.9 mg/dL (ref 0.2–1.2)

## 2016-03-03 MED ORDER — IBUPROFEN 600 MG PO TABS: 600.0000 mg | ORAL_TABLET | Freq: Three times a day (TID) | ORAL | 0 refills | Status: DC | PRN

## 2016-03-03 NOTE — Patient Instructions (Signed)
You may use ibuprofen as needed for pain.  Please go to the ER if you develop new/worsening pain or fever. Keep your upcoming appointment with Dr. Hulan Fray (GYN).

## 2016-03-03 NOTE — Progress Notes (Signed)
Pre visit review using our clinic review tool, if applicable. No additional management support is needed unless otherwise documented below in the visit note. 

## 2016-03-03 NOTE — Progress Notes (Signed)
Subjective:    Patient ID: Rose Berry, female    DOB: [birth year], 25 y.o.   MRN: [id number]  HPI  Rose Berry is a 25 yr old female who presents today with chief complaint of dysmenorrhea.  Reports that symptoms have been present x 6 months.  Reports that she recently learned that she has fibroids.  Pelvic ultrasound which was performed on 01/18/16 noted the following:  Right fundal submucosal 6.2 x 5.4 x 5.2 cm fibroid as well as a left-sided posterofundal intramural uterine fibroid measuring 2.3 x 1.9 x 2.2 cm.  Almost certainly to be benign complex cyst of right ovary with thin posterior septation measuring 4.2 x 3.7 x 4.2 cm.  She does not have her period today. LMP was 02/17/16.  Reports periods last 5 days and has associated cramping.  First 3 days of period she changes her pad every 2 hours. She reports current pain level is 7/10.  She reports that she often will have pain throughout her menstrual cycle. The pain and discomfort that she is having is consistent with the pain that she has each month.  She did see Dr. Hulan Fray last year.  Dr. Hulan Fray recommended an OCP.  She states she never received the Rx and never picked it up.  She has an appointment next Wednesday for follow up with Dr. Hulan Fray. She denies dysuria, painful intercourse, new sexual partners or vaginal discharge. She reports that her bowel movements are normal.   Lab Results  Component Value Date   WBC 6.5 01/16/2016   HGB 9.4 (L) 01/16/2016   HCT 31.4 (L) 01/16/2016   MCV 64.6 (L) 01/16/2016   PLT 395 01/16/2016    Review of Systems    see HPI  Past Medical History:  Diagnosis Date  . Anemia   . Asthma   . UTI (urinary tract infection)      Social History   Social History  . Marital status: Single    Spouse name: N/A  . Number of children: N/A  . Years of education: N/A   Occupational History  . Not on file.   Social History Main Topics  . Smoking status: Never Smoker  . Smokeless tobacco:  Never Used  . Alcohol use No  . Drug use: No  . Sexual activity: Yes    Birth control/ protection: Condom   Other Topics Concern  . Not on file   Social History Narrative  . No narrative on file    History reviewed. No pertinent surgical history.  Family History  Problem Relation Age of Onset  . Arthritis Mother   . Hypertension Mother   . Hyperlipidemia Maternal Grandmother   . Hypertension Maternal Grandmother   . Diabetes Maternal Grandmother   . Kidney disease Maternal Grandmother   . Hyperlipidemia Maternal Grandfather   . Hypertension Maternal Grandfather   . Diabetes Maternal Grandfather   . Kidney disease Maternal Grandfather   . Hyperlipidemia Paternal Grandmother   . Hypertension Paternal Grandmother   . Diabetes Paternal Grandmother   . Kidney disease Paternal Grandmother   . Hyperlipidemia Paternal Grandfather   . Hypertension Paternal Grandfather   . Diabetes Paternal Grandfather   . Kidney disease Paternal Grandfather     No Known Allergies  Current Outpatient Prescriptions on File Prior to Visit  Medication Sig Dispense Refill  . albuterol (PROVENTIL HFA;VENTOLIN HFA) 108 (90 Base) MCG/ACT inhaler Inhale 2 puffs into the lungs every 6 (six) hours as needed for wheezing or shortness of  breath. 1 Inhaler 0  . ferrous sulfate 325 (65 FE) MG tablet Take 1 tablet (325 mg total) by mouth 2 (two) times daily with a meal. 60 tablet 3   No current facility-administered medications on file prior to visit.     BP 134/76 (BP Location: Left Arm, Patient Position: Sitting, Cuff Size: Normal)   Pulse 98   Temp 98.4 F (36.9 C) (Oral)   Resp 16   Ht 5\' 2"  (1.575 m)   Wt 151 lb 9.6 oz (68.8 kg)   LMP 02/17/2016 (Exact Date)   SpO2 100% Comment: RA  BMI 27.73 kg/m    Objective:   Physical Exam  Constitutional: She appears well-developed and well-nourished.  Cardiovascular: Normal rate, regular rhythm and normal heart sounds.   No murmur  heard. Pulmonary/Chest: Effort normal and breath sounds normal. No respiratory distress. She has no wheezes.  Abdominal: Soft.  Mild RUQ and LUQ tenderness, more tender in suprapubic RLQ and LLQ.   Genitourinary:  Genitourinary Comments: Neg CMT + right adnexal mass is palpated with minimal discomfort on exam.    Psychiatric: She has a normal mood and affect. Her behavior is normal. Judgment and thought content normal.          Assessment & Plan:  Pelvic pain- will obtain urine hcg, CBC, cmet.  I think her pain is related to her ovarian cysts and her uterine fibroids.  She would likely benefit from an OCP, but wishes to further discuss with Dr. Hulan Fray next week.  She is advised to go to the ER if severe/worsening pain.  Swabs obtained for GC/Chlamydia and wet prep today.

## 2016-03-04 LAB — PREGNANCY, URINE: PREG TEST UR: NEGATIVE

## 2016-03-05 ENCOUNTER — Telehealth: Payer: Self-pay | Admitting: Family

## 2016-03-05 ENCOUNTER — Other Ambulatory Visit (INDEPENDENT_AMBULATORY_CARE_PROVIDER_SITE_OTHER): Payer: Self-pay

## 2016-03-05 DIAGNOSIS — D649 Anemia, unspecified: Secondary | ICD-10-CM

## 2016-03-05 LAB — CERVICOVAGINAL ANCILLARY ONLY
Chlamydia: NEGATIVE
Neisseria Gonorrhea: NEGATIVE
WET PREP (BD AFFIRM): POSITIVE — AB

## 2016-03-05 LAB — IRON: IRON: 14 ug/dL — AB (ref 42–145)

## 2016-03-05 NOTE — Telephone Encounter (Signed)
Iron level is low, I see she has iron on her list.  Has she been taking BID?  I would like her to take BID.  Repeat cbc, serum iron in 3 months.

## 2016-03-06 NOTE — Telephone Encounter (Signed)
Spoke w/ Pt, informed her of results. She has only been taking Ferrous Sulfate once daily instead of BID. She will increase. Appt scheduled for labs in 3 months.

## 2016-03-09 ENCOUNTER — Encounter: Payer: Self-pay | Admitting: Family Medicine

## 2016-03-09 ENCOUNTER — Ambulatory Visit (INDEPENDENT_AMBULATORY_CARE_PROVIDER_SITE_OTHER): Payer: Self-pay | Admitting: Family Medicine

## 2016-03-09 VITALS — BP 128/68 | HR 88 | Ht 62.0 in | Wt 158.0 lb

## 2016-03-09 DIAGNOSIS — N920 Excessive and frequent menstruation with regular cycle: Secondary | ICD-10-CM

## 2016-03-09 DIAGNOSIS — D250 Submucous leiomyoma of uterus: Secondary | ICD-10-CM

## 2016-03-09 DIAGNOSIS — D251 Intramural leiomyoma of uterus: Secondary | ICD-10-CM

## 2016-03-09 DIAGNOSIS — N760 Acute vaginitis: Secondary | ICD-10-CM

## 2016-03-09 DIAGNOSIS — N83201 Unspecified ovarian cyst, right side: Secondary | ICD-10-CM

## 2016-03-09 DIAGNOSIS — B9689 Other specified bacterial agents as the cause of diseases classified elsewhere: Secondary | ICD-10-CM

## 2016-03-09 DIAGNOSIS — R102 Pelvic and perineal pain unspecified side: Secondary | ICD-10-CM

## 2016-03-09 DIAGNOSIS — D219 Benign neoplasm of connective and other soft tissue, unspecified: Secondary | ICD-10-CM | POA: Insufficient documentation

## 2016-03-09 MED ORDER — METRONIDAZOLE 500 MG PO TABS: 500.0000 mg | ORAL_TABLET | Freq: Two times a day (BID) | ORAL | 0 refills | Status: DC

## 2016-03-09 MED ORDER — DOXYCYCLINE HYCLATE 100 MG PO CAPS: 100.0000 mg | ORAL_CAPSULE | Freq: Two times a day (BID) | ORAL | 0 refills | Status: DC

## 2016-03-09 MED FILL — DOXYCYCLINE HYCLATE 100 MG: 100 | 10 days supply | Qty: 20 | Fill #0

## 2016-03-09 MED FILL — metroNIDAZOLE 500 MG TABS: 500 | 7 days supply | Qty: 14 | Fill #0

## 2016-03-09 NOTE — Assessment & Plan Note (Signed)
Given the CMT and her BV--will treat with Doxy, flagyl and see if this helps to alleviate her acute pain. Fibroids do not usually give one acute pain like this unless it is infarcting, which it might be, but Doxy and Ibuprofen would both lessen the inflammation associated with acute infarction.

## 2016-03-09 NOTE — Assessment & Plan Note (Signed)
Declines trial of OC's. Normal evolution of fibroids, potential impact on fertility, need for myomectomy, hysterectomy discussed at length. Her fibroids are not that numerous, her uterus is not that enlarged. She is not at all interested in OC's, but that might be a short term solution. She is not currently interested in child-bearing, but the issues are discussed at length, including consideration of not delaying child-bearing for more than 3-5 years. She will also apply for charity care, to help if surgery or further evaluation is needed.

## 2016-03-09 NOTE — Assessment & Plan Note (Signed)
Appears benign on my viewing of the films today. Would repeat to ensure resolution.

## 2016-03-09 NOTE — Progress Notes (Signed)
Subjective:    Patient ID: Rose Berry is a 25 y.o. female presenting with Abdominal Pain  on 03/09/2016  HPI: Reports that she is in a lot of pain. One month ago, she saw another MD and had an u/s that showed a cyst and fibroids. According to notes, her chief complaint was fatigue and weakness. She now states her biggest issue is abdominal pain. Reports left sided flank pain that comes and goes. No worsening with her cycle. Worse with movement. Takes ibuprofen which helps. Seen in PCP office last week with negative GC/Chlam infection and + for BV, untreated right now. Also reports heavy cycles for some time. Last hgb was 10.5. (previously as low as 9.3) on Iron. Seen here by Dr. Hulan Fray last September with normal pap. Supposed to have an ultrasound and begin OC's. She did neither.  Review of Systems  Constitutional: Positive for fatigue. Negative for chills and fever.  Respiratory: Negative for shortness of breath.   Cardiovascular: Negative for chest pain.  Gastrointestinal: Positive for abdominal pain. Negative for nausea and vomiting.  Genitourinary: Positive for menstrual problem, pelvic pain and vaginal bleeding. Negative for dysuria.  Skin: Negative for rash.      Objective:    BP 128/68 (BP Location: Left Arm, Patient Position: Sitting)   Pulse 88   Ht 5\' 2"  (1.575 m)   Wt 158 lb (71.7 kg)   LMP 02/17/2016 (Exact Date)   BMI 28.90 kg/m  Physical Exam  Constitutional: She is oriented to person, place, and time. She appears well-developed and well-nourished. No distress.  HENT:  Head: Normocephalic and atraumatic.  Eyes: No scleral icterus.  Neck: Neck supple.  Cardiovascular: Normal rate.   Pulmonary/Chest: Effort normal.  Abdominal: Soft.  Genitourinary:  Genitourinary Comments: BUS normal, vagina is pink and rugated, cervix is nulliparous without lesion, uterus is 8 wk size anteverted, with fibroid noted off the right side, + CMT, + uterine tenderness, no adnexal  mass or tenderness.   Neurological: She is alert and oriented to person, place, and time.  Skin: Skin is warm and dry.  Psychiatric: She has a normal mood and affect.        Assessment & Plan:   Problem List Items Addressed This Visit      Unprioritized   Menorrhagia with regular cycle    Fibroids likely play a role here. Again, OC's would help with this somewhat, but she is quite reluctant to use. May be a candidate for hormonal containing IUD to control bleeding at some point later. Hgb is stable for now.       Fibroid    Declines trial of OC's. Normal evolution of fibroids, potential impact on fertility, need for myomectomy, hysterectomy discussed at length. Her fibroids are not that numerous, her uterus is not that enlarged. She is not at all interested in OC's, but that might be a short term solution. She is not currently interested in child-bearing, but the issues are discussed at length, including consideration of not delaying child-bearing for more than 3-5 years. She will also apply for charity care, to help if surgery or further evaluation is needed.      Pelvic pain - Primary    Given the CMT and her BV--will treat with Doxy, flagyl and see if this helps to alleviate her acute pain. Fibroids do not usually give one acute pain like this unless it is infarcting, which it might be, but Doxy and Ibuprofen would both lessen the inflammation associated with  acute infarction.      Relevant Medications   doxycycline (VIBRAMYCIN) 100 MG capsule   Other Relevant Orders   US Transvaginal Non-OB   Right ovarian cyst    Appears benign on my viewing of the films today. Would repeat to ensure resolution.      Relevant Orders   US Transvaginal Non-OB    Other Visit Diagnoses    Bacterial vaginosis       Relevant Medications   metroNIDAZOLE (FLAGYL) 500 MG tablet      Total face-to-face time with patient: 25 minutes. Over 50% of encounter was spent on counseling and coordination  of care. Return in about 3 months (around 06/09/2016) for a follow-up.  Donnamae Jude 03/09/2016 9:51 AM

## 2016-03-09 NOTE — Patient Instructions (Signed)
Ovarian Cyst An ovarian cyst is a fluid-filled sac that forms on an ovary. The ovaries are small organs that produce eggs in women. Various types of cysts can form on the ovaries. Most are not cancerous. Many do not cause problems, and they often go away on their own. Some may cause symptoms and require treatment. Common types of ovarian cysts include:  Functional cysts--These cysts may occur every month during the menstrual cycle. This is normal. The cysts usually go away with the next menstrual cycle if the woman does not get pregnant. Usually, there are no symptoms with a functional cyst.  Endometrioma cysts--These cysts form from the tissue that lines the uterus. They are also called "chocolate cysts" because they become filled with blood that turns brown. This type of cyst can cause pain in the lower abdomen during intercourse and with your menstrual period.  Cystadenoma cysts--This type develops from the cells on the outside of the ovary. These cysts can get very big and cause lower abdomen pain and pain with intercourse. This type of cyst can twist on itself, cut off its blood supply, and cause severe pain. It can also easily rupture and cause a lot of pain.  Dermoid cysts--This type of cyst is sometimes found in both ovaries. These cysts may contain different kinds of body tissue, such as skin, teeth, hair, or cartilage. They usually do not cause symptoms unless they get very big.  Theca lutein cysts--These cysts occur when too much of a certain hormone (human chorionic gonadotropin) is produced and overstimulates the ovaries to produce an egg. This is most common after procedures used to assist with the conception of a baby (in vitro fertilization). CAUSES   Fertility drugs can cause a condition in which multiple large cysts are formed on the ovaries. This is called ovarian hyperstimulation syndrome.  A condition called polycystic ovary syndrome can cause hormonal imbalances that can lead to  nonfunctional ovarian cysts. SIGNS AND SYMPTOMS  Many ovarian cysts do not cause symptoms. If symptoms are present, they may include:  Pelvic pain or pressure.  Pain in the lower abdomen.  Pain during sexual intercourse.  Increasing girth (swelling) of the abdomen.  Abnormal menstrual periods.  Increasing pain with menstrual periods.  Stopping having menstrual periods without being pregnant. DIAGNOSIS  These cysts are commonly found during a routine or annual pelvic exam. Tests may be ordered to find out more about the cyst. These tests may include:  Ultrasound.  X-ray of the pelvis.  CT scan.  MRI.  Blood tests. TREATMENT  Many ovarian cysts go away on their own without treatment. Your health care provider may want to check your cyst regularly for 2-3 months to see if it changes. For women in menopause, it is particularly important to monitor a cyst closely because of the higher rate of ovarian cancer in menopausal women. When treatment is needed, it may include any of the following:  A procedure to drain the cyst (aspiration). This may be done using a long needle and ultrasound. It can also be done through a laparoscopic procedure. This involves using a thin, lighted tube with a tiny camera on the end (laparoscope) inserted through a small incision.  Surgery to remove the whole cyst. This may be done using laparoscopic surgery or an open surgery involving a larger incision in the lower abdomen.  Hormone treatment or birth control pills. These methods are sometimes used to help dissolve a cyst. HOME CARE INSTRUCTIONS   Only take over-the-counter   or prescription medicines as directed by your health care provider.  Follow up with your health care provider as directed.  Get regular pelvic exams and Pap tests. SEEK MEDICAL CARE IF:   Your periods are late, irregular, or painful, or they stop.  Your pelvic pain or abdominal pain does not go away.  Your abdomen becomes  larger or swollen.  You have pressure on your bladder or trouble emptying your bladder completely.  You have pain during sexual intercourse.  You have feelings of fullness, pressure, or discomfort in your stomach.  You lose weight for no apparent reason.  You feel generally ill.  You become constipated.  You lose your appetite.  You develop acne.  You have an increase in body and facial hair.  You are gaining weight, without changing your exercise and eating habits.  You think you are pregnant. SEEK IMMEDIATE MEDICAL CARE IF:   You have increasing abdominal pain.  You feel sick to your stomach (nauseous), and you throw up (vomit).  You develop a fever that comes on suddenly.  You have abdominal pain during a bowel movement.  Your menstrual periods become heavier than usual. MAKE SURE YOU:  Understand these instructions.  Will watch your condition.  Will get help right away if you are not doing well or get worse.   This information is not intended to replace advice given to you by your health care provider. Make sure you discuss any questions you have with your health care provider.   Document Released: 04/13/2005 Document Revised: 04/18/2013 Document Reviewed: 12/19/2012 Elsevier Interactive Patient Education 2016 Elsevier Inc[employer] Uterine Fibroids Uterine fibroids are tissue masses (tumors) that can develop in the womb (uterus). They are also called leiomyomas. This type of tumor is not cancerous (benign) and does not spread to other parts of the body outside of the pelvic area, which is between the hip bones. Occasionally, fibroids may develop in the fallopian tubes, in the cervix, or on the support structures (ligaments) that surround the uterus. You can have one or many fibroids. Fibroids can vary in size, weight, and where they grow in the uterus. Some can become quite large. Most fibroids do not require medical treatment. CAUSES A fibroid can develop when a single  uterine cell keeps growing (replicating). Most cells in the human body have a control mechanism that keeps them from replicating without control. SIGNS AND SYMPTOMS Symptoms may include:   Heavy bleeding during your period.  Bleeding or spotting between periods.  Pelvic pain and pressure.  Bladder problems, such as needing to urinate more often (urinary frequency) or urgently.  Inability to reproduce offspring (infertility).  Miscarriages. DIAGNOSIS Uterine fibroids are diagnosed through a physical exam. Your health care provider may feel the lumpy tumors during a pelvic exam. Ultrasonography and an MRI may be done to determine the size, location, and number of fibroids. TREATMENT Treatment may include:  Watchful waiting. This involves getting the fibroid checked by your health care provider to see if it grows or shrinks. Follow your health care provider's recommendations for how often to have this checked.  Hormone medicines. These can be taken by mouth or given through an intrauterine device (IUD).  Surgery.  Removing the fibroids (myomectomy) or the uterus (hysterectomy).  Removing blood supply to the fibroids (uterine artery embolization). If fibroids interfere with your fertility and you want to become pregnant, your health care provider may recommend having the fibroids removed.  HOME CARE INSTRUCTIONS  Keep all follow-up visits as  directed by your health care provider. This is important.  Take medicines only as directed by your health care provider.  If you were prescribed a hormone treatment, take the hormone medicines exactly as directed.  Do not take aspirin, because it can cause bleeding.  Ask your health care provider about taking iron pills and increasing the amount of dark green, leafy vegetables in your diet. These actions can help to boost your blood iron levels, which may be affected by heavy menstrual bleeding.  Pay close attention to your period and tell  your health care provider about any changes, such as:  Increased blood flow that requires you to use more pads or tampons than usual per month.  A change in the number of days that your period lasts per month.  A change in symptoms that are associated with your period, such as abdominal cramping or back pain. SEEK MEDICAL CARE IF:  You have pelvic pain, back pain, or abdominal cramps that cannot be controlled with medicines.  You have an increase in bleeding between and during periods.  You soak tampons or pads in a half hour or less.  You feel lightheaded, extra tired, or weak. SEEK IMMEDIATE MEDICAL CARE IF:  You faint.  You have a sudden increase in pelvic pain.   This information is not intended to replace advice given to you by your health care provider. Make sure you discuss any questions you have with your health care provider.   Document Released: 04/10/2000 Document Revised: 05/04/2014 Document Reviewed: 10/10/2013 Elsevier Interactive Patient Education [employer].

## 2016-03-09 NOTE — Assessment & Plan Note (Signed)
Fibroids likely play a role here. Again, OC's would help with this somewhat, but she is quite reluctant to use. May be a candidate for hormonal containing IUD to control bleeding at some point later. Hgb is stable for now.

## 2016-03-09 NOTE — Progress Notes (Signed)
Patient complaining of pain in lower abdomen and left side. [name] RNBSN

## 2016-03-11 ENCOUNTER — Encounter: Payer: Self-pay | Admitting: Obstetrics & Gynecology

## 2016-05-05 ENCOUNTER — Emergency Department (HOSPITAL_COMMUNITY): Admission: EM | Admit: 2016-05-05 | Discharge: 2016-05-05 | Discharge status: Against Medical Advice | Payer: Self-pay

## 2016-05-05 ENCOUNTER — Telehealth: Payer: Self-pay | Admitting: Medical

## 2016-05-05 NOTE — ED Triage Notes (Signed)
Pt did not answer x 2 

## 2016-05-05 NOTE — ED Notes (Signed)
Called pt to triaged 2 more times, no response. Unable to locate pt in waiting room at this time.
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
## 2016-05-05 NOTE — ED Notes (Signed)
Called pt to be triaged, with no response.

## 2016-05-05 NOTE — Telephone Encounter (Signed)
Pt went to the ER and left.  Pt scheduled to see provider at 3 pm.  Per scheduler, pt states that was the only time she could come in.

## 2016-05-05 NOTE — Telephone Encounter (Signed)
[facility] nurse, Almyra Free, called stating that patient hung up abruptly while she was on hold for [facility]. She was unable to even speak with the patient. Almyra Free stated that she called the return phone number back multiple times and did not get an answer, she was concerned. I called the patient and she answered. She stated whispering, "can you call me back in 2 mins, I am....(unaudiable)" the phone then hung up. I informed Almyra Free of the conversation and asked Almyra Free to call her back in 3-5 mins as the patient requested.
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
## 2016-05-05 NOTE — Telephone Encounter (Signed)
Patient called back in said she has knot on her chin and bruising on her body. Patient hung up while on hold for [facility]. [facility] [place] stated she would have the nurse to call her back and the listed number
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
## 2016-05-06 ENCOUNTER — Encounter: Payer: Self-pay | Admitting: Medical

## 2016-05-06 ENCOUNTER — Ambulatory Visit (HOSPITAL_BASED_OUTPATIENT_CLINIC_OR_DEPARTMENT_OTHER)
Admission: RE | Admit: 2016-05-06 | Discharge: 2016-05-06 | Disposition: A | Discharge status: Home / Self Care | Payer: Self-pay | Source: Ambulatory Visit | Attending: Medical | Admitting: Medical

## 2016-05-06 ENCOUNTER — Encounter (HOSPITAL_BASED_OUTPATIENT_CLINIC_OR_DEPARTMENT_OTHER): Payer: Self-pay

## 2016-05-06 ENCOUNTER — Ambulatory Visit (INDEPENDENT_AMBULATORY_CARE_PROVIDER_SITE_OTHER): Payer: Self-pay | Admitting: Medical

## 2016-05-06 VITALS — BP 107/70 | HR 80 | Temp 98.4°F | Resp 16 | Ht 62.0 in | Wt 153.4 lb

## 2016-05-06 DIAGNOSIS — M25512 Pain in left shoulder: Secondary | ICD-10-CM

## 2016-05-06 DIAGNOSIS — M898X1 Other specified disorders of bone, shoulder: Secondary | ICD-10-CM

## 2016-05-06 MED ORDER — CYCLOBENZAPRINE HCL 10 MG PO TABS: ORAL_TABLET | ORAL | 0 refills | Status: DC

## 2016-05-06 MED ORDER — DICLOFENAC SODIUM 75 MG PO TBEC: 75.0000 mg | DELAYED_RELEASE_TABLET | Freq: Two times a day (BID) | ORAL | 0 refills | Status: DC

## 2016-05-06 MED FILL — DICLOFENAC SOD 75 MG TAB EC: 75 | 10 days supply | Qty: 20 | Fill #0

## 2016-05-06 MED FILL — CYCLOBENZAPRINE 10 MG TAB: 10 | 10 days supply | Qty: 10 | Fill #0

## 2016-05-06 NOTE — Patient Instructions (Addendum)
For your diffuse muscle aches will rx flexeril to use at night. For pain and inflammation rx diclofenac.Stop otc nsaids  Follow up on Monday to recheck. If soreness or pain persists might get more x-rays.  If you have mood or anxiety issues that develop please let us know.  Get xrays today of shoulder and scapula.

## 2016-05-06 NOTE — Telephone Encounter (Signed)
Pt went to the ER and left before being seen.  Pt has an appt today (05/06/16) at 3 pm with Mackie Pai, PA-C

## 2016-05-06 NOTE — Progress Notes (Signed)
Pre visit review using our clinic review tool, if applicable. No additional management support is needed unless otherwise documented below in the visit note/SLS  
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
## 2016-05-28 ENCOUNTER — Ambulatory Visit: Payer: Self-pay | Admitting: Family Medicine

## 2016-06-09 ENCOUNTER — Other Ambulatory Visit: Payer: Self-pay

## 2016-06-15 ENCOUNTER — Ambulatory Visit: Payer: Self-pay | Admitting: Family Medicine

## 2016-06-22 ENCOUNTER — Other Ambulatory Visit (INDEPENDENT_AMBULATORY_CARE_PROVIDER_SITE_OTHER): Payer: Self-pay

## 2016-06-22 DIAGNOSIS — R7989 Other specified abnormal findings of blood chemistry: Secondary | ICD-10-CM

## 2016-06-22 DIAGNOSIS — R946 Abnormal results of thyroid function studies: Secondary | ICD-10-CM

## 2016-06-22 DIAGNOSIS — D649 Anemia, unspecified: Secondary | ICD-10-CM

## 2016-06-22 LAB — CBC WITH DIFFERENTIAL/PLATELET
Basophils Absolute: 0 10*3/uL (ref 0.0–0.1)
Basophils Relative: 1.1 % (ref 0.0–3.0)
Eosinophils Absolute: 0.1 10*3/uL (ref 0.0–0.7)
Eosinophils Relative: 1.3 % (ref 0.0–5.0)
HCT: 27.7 % — ABNORMAL LOW (ref 36.0–46.0)
Hemoglobin: 8.5 g/dL — ABNORMAL LOW (ref 12.0–15.0)
LYMPHS ABS: 1.7 10*3/uL (ref 0.7–4.0)
Lymphocytes Relative: 38.3 % (ref 12.0–46.0)
MCHC: 30.7 g/dL (ref 30.0–36.0)
MONO ABS: 0.3 10*3/uL (ref 0.1–1.0)
MONOS PCT: 6.2 % (ref 3.0–12.0)
NEUTROS ABS: 2.4 10*3/uL (ref 1.4–7.7)
NEUTROS PCT: 53.1 % (ref 43.0–77.0)
PLATELETS: 342 10*3/uL (ref 150.0–400.0)
RBC: 4.42 Mil/uL (ref 3.87–5.11)
RDW: 19.4 % — ABNORMAL HIGH (ref 11.5–15.5)
WBC: 4.4 10*3/uL (ref 4.0–10.5)

## 2016-06-22 LAB — T4: T4, Total: 7.1 ug/dL (ref 4.5–12.0)

## 2016-06-22 LAB — TSH: TSH: 1.08 u[IU]/mL (ref 0.35–4.50)

## 2016-06-22 LAB — T3: T3, Total: 94 ng/dL (ref 76–181)

## 2016-06-22 LAB — IRON: IRON: 42 ug/dL (ref 42–145)

## 2016-06-24 ENCOUNTER — Telehealth: Payer: Self-pay | Admitting: Family

## 2016-06-24 NOTE — Telephone Encounter (Signed)
Spoke wit pt about results, Pt voiced understanding

## 2016-06-24 NOTE — Telephone Encounter (Signed)
Thanks for your help with my patient.

## 2016-06-24 NOTE — Telephone Encounter (Signed)
Please contact patient and let her know that her iron level looks better but her anemia is worse. This is likely related her heavy menstrual bleeding. I would recommend that she follow back up with Dr. Darron Doom to discuss possibility of oral contraceptives or IUD to help control her menstrual bleeding. (I have shared the results with Dr. Kennon Rounds as well)  I would also recommend that she continue iron and follow back up with Percell Miller in 1 month.  Sooner if shortness of breath, weakness occurs.

## 2016-07-03 ENCOUNTER — Ambulatory Visit (INDEPENDENT_AMBULATORY_CARE_PROVIDER_SITE_OTHER): Payer: Self-pay | Admitting: Family Medicine

## 2016-07-03 ENCOUNTER — Encounter: Payer: Self-pay | Admitting: Family Medicine

## 2016-07-03 DIAGNOSIS — N920 Excessive and frequent menstruation with regular cycle: Secondary | ICD-10-CM

## 2016-07-03 MED ORDER — MEGESTROL ACETATE 40 MG PO TABS: 40.0000 mg | ORAL_TABLET | Freq: Two times a day (BID) | ORAL | 3 refills | Status: DC

## 2016-07-03 NOTE — Progress Notes (Signed)
   Subjective:    Patient ID: Rose Berry is a 26 y.o. female presenting with Follow-up (Plevic pain)  on 07/03/2016  HPI: Here today to f/u pelvic pain. Reports pain is coming and going and happening every other day. Lasts 10 seconds or less. She reports that things feel worse. Her last 2 periods have lasted 7 days and have clotting. Pain improved somewhat following treatment with doxycycline and ibuprofen. Previously offered OC's, IUD, depo and has refused all of these. She is a G0 and attempts at pregnancy last year were not successful.  Review of Systems  Constitutional: Negative for chills and fever.  Respiratory: Negative for shortness of breath.   Cardiovascular: Negative for chest pain.  Gastrointestinal: Negative for abdominal pain, nausea and vomiting.  Genitourinary: Negative for dysuria.  Skin: Negative for rash.      Objective:    BP 123/77   Pulse 84   Ht 5\' 2"  (1.575 m)   Wt 156 lb (70.8 kg)   LMP 06/25/2016   BMI 28.53 kg/m  Physical Exam  Constitutional: She is oriented to person, place, and time. She appears well-developed and well-nourished. No distress.  HENT:  Head: Normocephalic and atraumatic.  Eyes: No scleral icterus.  Neck: Neck supple.  Cardiovascular: Normal rate.   Pulmonary/Chest: Effort normal.  Abdominal: Soft.  Neurological: She is alert and oriented to person, place, and time.  Skin: Skin is warm and dry.  Psychiatric: She has a normal mood and affect.        Assessment & Plan:   Problem List Items Addressed This Visit      Unprioritized   Menorrhagia with regular cycle    Desires trial of megace to shorten cycle. Offered multiple treatment options again, and she does not want any form of contraception. States she will consider myomectomy. Risks reviewed. Large 6 cm submucosal fibroid. Needs to get charity care set up or try to arrange for some insurance coverage.      Relevant Medications   megestrol (MEGACE) 40 MG tablet    Could consider REI referral with robotic removal of fibroid if gets insurance.  Total face-to-face time with patient: 15 minutes. Over 50% of encounter was spent on counseling and coordination of care. Return in about 6 months (around 01/03/2017).  Donnamae Jude 07/03/2016 10:55 AM

## 2016-07-03 NOTE — Patient Instructions (Signed)
Uterine Fibroids Uterine fibroids are tissue masses (tumors). They are also called leiomyomas. They can develop inside of a woman's womb (uterus). They can grow very large. Fibroids are not cancerous (benign). Most fibroids do not require medical treatment. Follow these instructions at home:  Keep all follow-up visits as told by your doctor. This is important.  Take medicines only as told by your doctor.  If you were prescribed a hormone treatment, take the hormone medicines exactly as told.  Do not take aspirin. It can cause bleeding.  Ask your doctor about taking iron pills and increasing the amount of dark green, leafy vegetables in your diet. These actions can help to boost your blood iron levels.  Pay close attention to your period. Tell your doctor about any changes, such as:  Increased blood flow. This may require you to use more pads or tampons than usual per month.  A change in the number of days that your period lasts per month.  A change in symptoms that come with your period, such as back pain or cramping in your belly area (abdomen). Contact a doctor if:  You have pain in your back or the area between your hip bones (pelvic area) that is not controlled by medicines.  You have pain in your abdomen that is not controlled with medicines.  You have an increase in bleeding between and during periods.  You soak tampons or pads in a half hour or less.  You feel lightheaded.  You feel extra tired.  You feel weak. Get help right away if:  You pass out (faint).  You have a sudden increase in pelvic pain. This information is not intended to replace advice given to you by your health care provider. Make sure you discuss any questions you have with your health care provider. Document Released: 05/16/2010 Document Revised: 12/13/2015 Document Reviewed: 10/10/2013 Elsevier Interactive Patient Education  2017 [employer].

## 2016-07-03 NOTE — Assessment & Plan Note (Addendum)
Desires trial of megace to shorten cycle. Offered multiple treatment options again, and she does not want any form of contraception. States she will consider myomectomy. Risks reviewed. Large 6 cm submucosal fibroid. Needs to get charity care set up or try to arrange for some insurance coverage.

## 2016-07-06 ENCOUNTER — Encounter: Payer: Self-pay | Admitting: Family Medicine

## 2016-08-04 ENCOUNTER — Telehealth: Payer: Self-pay

## 2016-08-04 NOTE — Telephone Encounter (Signed)
Patient states that the last two days she has been in pain. Patient states that she has not had any bleeding. Patient states that she now lives in [place]. Patient requested "a pill".  Explained to patient that per her email with Dr. Kennon Rounds she needs to follow up in office if her symptoms continue.   Patient has no insurance and would like to be evaluated. Patient spoke with Nicole Kindred our scheduler and she gave her the option to come to this office and have to pay something at her visit or try and get in [facility] hospital to see how she can set up some payment options. [name] RNBSN

## 2016-11-30 ENCOUNTER — Ambulatory Visit: Payer: Self-pay | Admitting: Family Medicine

## 2016-12-07 ENCOUNTER — Ambulatory Visit: Payer: Self-pay | Admitting: Family Medicine

## 2017-01-08 ENCOUNTER — Ambulatory Visit: Payer: Self-pay | Admitting: Family Medicine

## 2017-01-08 DIAGNOSIS — Z01419 Encounter for gynecological examination (general) (routine) without abnormal findings: Secondary | ICD-10-CM

## 2018-08-04 MED ORDER — HYDRALAZINE HCL 20 MG/ML IJ SOLN
10.00 | INTRAMUSCULAR | Status: DC
Start: ? — End: 2018-08-04

## 2018-08-04 MED ORDER — GENERIC EXTERNAL MEDICATION
Status: DC
Start: ? — End: 2018-08-04

## 2018-08-04 MED ORDER — ALUM & MAG HYDROXIDE-SIMETH 200-200-20 MG/5ML PO SUSP
30.00 | ORAL | Status: DC
Start: ? — End: 2018-08-04

## 2018-08-04 MED ORDER — SODIUM CHLORIDE 0.9 % IV SOLN
10.00 | INTRAVENOUS | Status: DC
Start: ? — End: 2018-08-04

## 2018-08-04 MED ORDER — ACETAMINOPHEN 325 MG PO TABS
650.00 | ORAL_TABLET | ORAL | Status: DC
Start: ? — End: 2018-08-04

## 2018-08-04 MED ORDER — HYDROCODONE-ACETAMINOPHEN 5-325 MG PO TABS
1.00 | ORAL_TABLET | ORAL | Status: DC
Start: ? — End: 2018-08-04

## 2018-08-04 MED ORDER — SENNA-DOCUSATE SODIUM 8.6-50 MG PO TABS
1.00 | ORAL_TABLET | ORAL | Status: DC
Start: ? — End: 2018-08-04

## 2018-09-14 ENCOUNTER — Encounter: Payer: Self-pay | Admitting: Obstetrics & Gynecology

## 2018-09-14 ENCOUNTER — Encounter: Payer: Self-pay | Admitting: General Practice

## 2018-09-14 ENCOUNTER — Ambulatory Visit (INDEPENDENT_AMBULATORY_CARE_PROVIDER_SITE_OTHER): Payer: Self-pay | Admitting: Obstetrics & Gynecology

## 2018-09-14 ENCOUNTER — Other Ambulatory Visit: Payer: Self-pay

## 2018-09-14 VITALS — BP 112/50 | HR 77 | Ht 62.0 in | Wt 157.1 lb

## 2018-09-14 DIAGNOSIS — D251 Intramural leiomyoma of uterus: Secondary | ICD-10-CM | POA: Diagnosis not present

## 2018-09-14 DIAGNOSIS — D62 Acute posthemorrhagic anemia: Secondary | ICD-10-CM

## 2018-09-14 MED ORDER — MEGESTROL ACETATE 40 MG PO TABS: 40.0000 mg | ORAL_TABLET | Freq: Two times a day (BID) | ORAL | 5 refills | Status: DC

## 2018-09-14 NOTE — Progress Notes (Signed)
History:  28 y.o. G0P0000 here today for eval of AUB. Pt reports monthly cycles that last 5-7 days. She reports heavy cycles on days 1-3 assoc with clots.  She is actively trying to conceive but, is aware that her fibroids are growing and she wants management of her fibroids. Pt recently started a new job. She was limited from having a procedure prev due to lack of insurance. She reports pressure sx.  She has had to be admitted due to blood transfusions due to sx anemia. She was prev on OCPs but, the side effects were limiting.        The following portions of the patient's history were reviewed and updated as appropriate: allergies, current medications, past family history, past medical history, past social history, past surgical history and problem list.  Review of Systems:  Pertinent items are noted in HPI.    Objective:  Physical Exam Blood pressure (!) 112/50, pulse 77, height 5\' 2"  (1.575 m), weight 157 lb 1.9 oz (71.3 kg), last menstrual period 09/09/2018.  CONSTITUTIONAL: Well-developed, well-nourished female in no acute distress.  HENT:  Normocephalic, atraumatic EYES: Conjunctivae and EOM are normal. No scleral icterus.  NECK: Normal range of motion SKIN: Skin is warm and dry. No rash noted. Not diaphoretic.No pallor. [place]: Alert and oriented to person, place, and time. Normal coordination.  Abd: Soft, nontender and nondistended; mass effect to umbilicus Pelvic: Normal appearing external genitalia; normal appearing vaginal mucosa and cervix.  Normal discharge.  Enlarged uterus 16 weeks sized. The base is wide. The uterus is mobile. No other palpable masses, no uterine or adnexal tenderness  Labs and Imaging CLINICAL DATA:  Pelvic cramping and heavy menses.  Fibroids.  EXAM: TRANSABDOMINAL AND TRANSVAGINAL ULTRASOUND OF PELVIS  TECHNIQUE: Both transabdominal and transvaginal ultrasound examinations of the pelvis were performed. Transabdominal technique was performed  for global imaging of the pelvis including uterus, ovaries, adnexal regions, and pelvic cul-de-sac. It was necessary to proceed with endovaginal exam following the transabdominal exam to visualize the uterus, endometrium and right ovary.  COMPARISON:  CT from 12/12/2014  FINDINGS: Uterus  Measurements: 9.8 x 7.1 x 7.1 cm.  There is a solid-appearing heterogeneous, predominantly hypoechoic uterine mass with speckled areas of increased echogenicity but without shadowing (to suggest definite calcifications), impressing upon the endometrial stripe. It is centered in the right fundus measuring 6.2 x 5.4 x 5.2 cm and demonstrates peripheral as well as some intramural vascularity. There is an intramural isoechoic to hypoechoic noncalcified solid posterior left-sided fundal fibroid also present measuring 2.3 x 1.9 x 2.2 cm. There is some demonstrable peripheral an intramural vascularity demonstrated.  Endometrium  Thickness: 12 mm. The endometrial stripe is displaced by of the fundal submucosal fibroid.  Right ovary  Measurements: 5.6 x 3.8 x 3.7 cm. There is a hypoechoic complex cyst with internal debris measuring 4.2 x 3.7 x 4.2 cm with a thin less than 1 mm septation posteriorly. There is no demonstrable vascularity within nor involving the septation. No mural nodularity is seen.  Left ovary  Measurements: 3.5 x 2.2 x 2.5 cm. Normal appearance/no adnexal mass.  Other findings  No abnormal free fluid.  The overlying bladder is unremarkable.  IMPRESSION: Right fundal submucosal 6.2 x 5.4 x 5.2 cm fibroid as well as a left-sided posterofundal intramural uterine fibroid measuring 2.3 x 1.9 x 2.2 cm.  Almost certainly to be benign complex cyst of right ovary with thin posterior septation measuring 4.2 x 3.7 x 4.2 cm.  Assessment & Plan:  Uterine fibroids  Discussed with pt surgical options. She is interested in a myomectomy. I spoke with her about a REI  consult to have a robotic myomectomy. She wants to consider this option and follow up after discussion with her husband.   Megace 40mg  bid  Anemia   TSH and CBC today   Total face-to-face time with patient was 20 min.  Greater than 50% was spent in counseling and coordination of care with the patient.     Jordanna Hendrie L. Harraway-Smith, M.D., Cherlynn June

## 2018-09-14 NOTE — Patient Instructions (Signed)
Myomectomy    Myomectomy is a surgery in which a non-cancerous fibroid (myoma) is removed from the uterus. Myomas are tumors made up of fibrous tissue. They are often called fibroid tumors. Fibroid tumors can range from the size of a pea to the size of a grapefruit. In a myomectomy, the fibroid tumor is removed without removing the uterus. Because these tumors are rarely cancerous, this surgery is usually done only if the tumor is growing or causing symptoms such as pain, pressure, bleeding, or pain with intercourse.  Tell a health care provider about:  · Any allergies you have.  · All medicines you are taking, including vitamins, herbs, eye drops, creams, and over-the-counter medicines.  · Any problems you or family members have had with anesthetic medicines.  · Any blood disorders you have.  · Any surgeries you have had.  · Any medical conditions you have.  What are the risks?  Generally, this is a safe procedure. However, problems may occur, including:  · Bleeding.  · Infection.  · Allergic reactions to medicines.  · Damage to other structures or organs.  · Blood clots in the legs, chest, or brain.  · Scar tissue on other organs and in the pelvis. This may require another surgery to remove the scar tissue.  What happens before the procedure?  Staying hydrated  Follow instructions from your health care provider about hydration, which may include:  · Up to 2 hours before the procedure - you may continue to drink clear liquids, such as water, clear fruit juice, black coffee, and plain tea.  Eating and drinking restrictions  Follow instructions from your health care provider about eating and drinking, which may include:  · 8 hours before the procedure - stop eating heavy meals or foods such as meat, fried foods, or fatty foods.  · 6 hours before the procedure - stop eating light meals or foods, such as toast or cereal.  · 6 hours before the procedure - stop drinking milk or drinks that contain milk.  · 2 hours before  the procedure - stop drinking clear liquids.  General instructions  · Ask your health care provider about:  ? Changing or stopping your regular medicines. This is especially important if you are taking diabetes medicines or blood thinners.  ? Taking medicines such as aspirin and ibuprofen. These medicines can thin your blood. Do not take these medicines before your procedure if your health care provider instructs you not to.  · Do not drink alcohol the day before the surgery.  · Do not use any products that contain nicotine or tobacco, such as cigarettes and e-cigarettes, for 2 weeks before the procedure. If you need help quitting, ask your health care provider.  · Plan to have someone take you home from the hospital or clinic. Also arrange for someone to help you with activities during your recovery.  What happens during the procedure?  · To reduce your risk of infection:  ? Your health care team will wash or sanitize their hands.  ? Your skin will be washed with soap.  ? Hair may be removed from the surgical area.  · An IV tube will be inserted into one of your veins. Medicines will be able to flow directly into your body through this IV tube.  · You will be given one or more of the following:  ? A medicine to help you relax (sedative).  ? A medicine to make you fall asleep (general anesthetic).  ·   Small monitors will be attached to your body. They will be used to check your heart, blood pressure, and oxygen level.  · A breathing tube will be placed into your lungs during the procedure.  · A thin, flexible tube (catheter) will be inserted into your bladder to collect urine.  · Your surgeon will use one of the following methods to perform the procedure. The method used will depend on the size, shape, location, and number of fibroids.  Hysteroscopic myomectomy  This method may be used when the fibroid tumor is inside the cavity of the uterus. A long, thin tube with a lens (hysteroscope) will be inserted into the  uterus through the vagina. A saline solution will be put into the uterus. This will expand the uterus and allow the surgeon to see the fibroids. Tools will be passed through the hysteroscope to remove the fibroid tumor in pieces.  Laparoscopic myomectomy  A few small incisions will be made in the lower abdomen. A thin, lighted tube with a camera (laparoscope) will be inserted through one of the incisions. This will give the surgeon a good view of the area. The fibroid tumor will be removed through the other incisions. The incisions will then be closed with stitches (sutures) or staples.  Abdominal myomectomy  This method is used when the fibroid tumor cannot be removed with a hysteroscope or laparoscope. The surgery will be done through a larger surgical incision in the abdomen. The fibroid tumor will be removed through this incision. The incision will be closed with sutures or staples. Recovery time will be longer if this method is used.  The procedures may vary among health care providers and hospitals.  What happens after the procedure?  · Your blood pressure, heart rate, breathing rate, and blood oxygen level will be monitored until the medicines you were given have worn off.  · The IV access tube and catheter will remain on your body for a period of time.  · You may be given medicine for pain or to help you sleep.  · You may be given an antibiotic medicine if needed.  · Do not drive for 24 hours if you were given a sedative.  Summary  · Myomectomy is surgery to remove a noncancerous fibroid (myoma) from the uterus.  · This surgery is usually done only if the tumor is growing or causing symptoms such as pain, pressure, bleeding, or pain during intercourse.  · Follow instructions from your health care provider about eating and drinking before the procedure.  · Recovery time from this procedure depends on the method. The abdominal method will require a longer recovery time.  This information is not intended to  replace advice given to you by your health care provider. Make sure you discuss any questions you have with your health care provider.  Document Released: 02/08/2007 Document Revised: 05/14/2016 Document Reviewed: 05/14/2016  Elsevier Interactive Patient Education © 2019 [employer]

## 2018-09-15 ENCOUNTER — Encounter: Payer: Self-pay | Admitting: Obstetrics & Gynecology

## 2018-09-15 LAB — CBC
Hematocrit: 26.2 % — ABNORMAL LOW (ref 34.0–46.6)
Hemoglobin: 8 g/dL — ABNORMAL LOW (ref 11.1–15.9)
MCH: 21.2 pg — ABNORMAL LOW (ref 26.6–33.0)
MCHC: 30.5 g/dL — ABNORMAL LOW (ref 31.5–35.7)
MCV: 69 fL — ABNORMAL LOW (ref 79–97)
Platelets: 308 10*3/uL (ref 150–450)
RBC: 3.78 x10E6/uL (ref 3.77–5.28)
RDW: 24.6 % — ABNORMAL HIGH (ref 11.7–15.4)
WBC: 5.2 10*3/uL (ref 3.4–10.8)

## 2018-09-15 LAB — TSH: TSH: 1.6 u[IU]/mL (ref 0.450–4.500)

## 2018-09-15 MED ORDER — MEGESTROL ACETATE 40 MG PO TABS: 40.0000 mg | ORAL_TABLET | Freq: Two times a day (BID) | ORAL | 5 refills | Status: DC

## 2018-09-22 ENCOUNTER — Telehealth: Payer: Self-pay

## 2018-09-22 NOTE — Telephone Encounter (Signed)
Unable to leave message patients phone cuts off. [name] RN

## 2018-09-22 NOTE — Telephone Encounter (Signed)
-----   Message from Lavonia Drafts, MD sent at 09/16/2018 12:50 PM EDT ----- Please call pt. She is anemic. She needs to begin FeSO4 TWICE A DAY (OTC- she may need a Rx called in if her insurance covers this).   She should take this med on an empty stomach with 1/2 cup of Orange juice or Vit C. She may eat after 1/2 hour (38min).  Please warn her that her stools may turn dark and she may develop some constipation while on this medication.     Thx, clh-S

## 2018-10-12 ENCOUNTER — Encounter: Payer: Self-pay | Admitting: Obstetrics & Gynecology

## 2018-10-12 ENCOUNTER — Ambulatory Visit (INDEPENDENT_AMBULATORY_CARE_PROVIDER_SITE_OTHER): Payer: BC Managed Care – PPO | Admitting: Obstetrics & Gynecology

## 2018-10-12 ENCOUNTER — Other Ambulatory Visit: Payer: Self-pay

## 2018-10-12 DIAGNOSIS — D219 Benign neoplasm of connective and other soft tissue, unspecified: Secondary | ICD-10-CM | POA: Diagnosis not present

## 2018-10-12 DIAGNOSIS — N946 Dysmenorrhea, unspecified: Secondary | ICD-10-CM

## 2018-10-12 MED ORDER — INTEGRA F 125-1 MG PO CAPS: 1.0000 | ORAL_CAPSULE | Freq: Every day | ORAL | 1 refills | Status: DC

## 2018-10-12 NOTE — Progress Notes (Signed)
    TELEHEALTH GYNECOLOGY VIRTUAL VIDEO VISIT ENCOUNTER NOTE  Provider location: [facility] at [facility]   I connected with Rinaldo Ratel on 10/12/18 at  4:15 PM EDT by WebEx Video Encounter at home and verified that I am speaking with the correct person using two identifiers.   I discussed the limitations, risks, security and privacy concerns of performing an evaluation and management service by telephone and the availability of in person appointments. I also discussed with the patient that there may be a patient responsible charge related to this service. The patient expressed understanding and agreed to proceed.   History:  Rose Berry is a 28 y.o. G0P0000 female being evaluated today for eval of AUB. LMP 1 week prev. Lighter, less painful and 1 day shorter.  She denies any abnormal vaginal discharge, pelvic pain or other concerns.  She did not get [place]. Prescribed Megace 40 mg bid.     Past Medical History:  Diagnosis Date  . Anemia   . Asthma   . UTI (urinary tract infection)    History reviewed. No pertinent surgical history. The following portions of the patient's history were reviewed and updated as appropriate: allergies, current medications, past family history, past medical history, past social history, past surgical history and problem list.    Review of Systems:  Pertinent items noted in HPI and remainder of comprehensive ROS otherwise negative.  Physical Exam:   General:  Alert, oriented and cooperative. Patient appears to be in no acute distress.  Mental Status: Normal mood and affect. Normal behavior. Normal judgment and thought content.   Respiratory: Normal respiratory effort, no problems with respiration noted  Rest of physical exam deferred due to type of encounter  Labs and Imaging   CBC Latest Ref Rng & Units 09/14/2018 06/22/2016 03/03/2016  WBC 3.4 - 10.8 x10E3/uL 5.2 4.4 7.2  Hemoglobin 11.1 - 15.9 g/dL 8.0(L) 8.5  Repeated and verified X2.(L) 10.5(L)  Hematocrit 34.0 - 46.6 % 26.2(L) 27.7(L) 33.9(L)  Platelets 150 - 450 x10E3/uL 308 342.0 426.0(H)   Assessment and Plan:     menorrhagia; fibroids  Pelvic US  F/u after [place] to discuss plan of care.  Rec compliance with the Megace 40mg  bid.   Anemia due to chronic blood loss.   Integra 1 po q day F/u in  3 weeks or sooner prn      I discussed the assessment and treatment plan with the patient. The patient was provided an opportunity to ask questions and all were answered. The patient agreed with the plan and demonstrated an understanding of the instructions.   The patient was advised to call back or seek an in-person evaluation/go to the ED if the symptoms worsen or if the condition fails to improve as anticipated.  I provided 12 minutes of face-to-face time during this encounter.   Lavonia Drafts, MD [facility], [place]

## 2018-10-12 NOTE — Progress Notes (Signed)
PT states last bleeding has improved. Pt would like to discuss surgery

## 2018-10-14 ENCOUNTER — Encounter: Payer: Self-pay | Admitting: Obstetrics & Gynecology

## 2018-10-18 ENCOUNTER — Ambulatory Visit (HOSPITAL_BASED_OUTPATIENT_CLINIC_OR_DEPARTMENT_OTHER)
Admission: RE | Admit: 2018-10-18 | Discharge: 2018-10-18 | Disposition: A | Discharge status: Home / Self Care | Payer: BC Managed Care – PPO | Source: Ambulatory Visit | Attending: Obstetrics & Gynecology | Admitting: Obstetrics & Gynecology

## 2018-10-18 ENCOUNTER — Ambulatory Visit (HOSPITAL_BASED_OUTPATIENT_CLINIC_OR_DEPARTMENT_OTHER): Payer: BC Managed Care – PPO

## 2018-10-18 ENCOUNTER — Other Ambulatory Visit: Payer: Self-pay

## 2018-10-18 DIAGNOSIS — N946 Dysmenorrhea, unspecified: Secondary | ICD-10-CM | POA: Diagnosis not present

## 2018-10-18 DIAGNOSIS — D219 Benign neoplasm of connective and other soft tissue, unspecified: Secondary | ICD-10-CM | POA: Diagnosis not present

## 2018-10-18 DIAGNOSIS — D259 Leiomyoma of uterus, unspecified: Secondary | ICD-10-CM | POA: Diagnosis not present

## 2018-11-10 ENCOUNTER — Other Ambulatory Visit: Payer: Self-pay

## 2018-11-10 ENCOUNTER — Ambulatory Visit (INDEPENDENT_AMBULATORY_CARE_PROVIDER_SITE_OTHER): Payer: BC Managed Care – PPO | Admitting: Obstetrics & Gynecology

## 2018-11-10 ENCOUNTER — Encounter: Payer: Self-pay | Admitting: Obstetrics & Gynecology

## 2018-11-10 VITALS — BP 115/69 | HR 72 | Ht 62.0 in | Wt 158.0 lb

## 2018-11-10 DIAGNOSIS — D251 Intramural leiomyoma of uterus: Secondary | ICD-10-CM | POA: Diagnosis not present

## 2018-11-10 DIAGNOSIS — D500 Iron deficiency anemia secondary to blood loss (chronic): Secondary | ICD-10-CM

## 2018-11-10 NOTE — Patient Instructions (Signed)
Hysterosalpingography  Hysterosalpingography is a procedure in which a woman's uterus and fallopian tubes are examined. During this procedure, contrast dye is injected into the uterus through the vagina and cervix. X-rays are then taken. The dye makes the uterus and fallopian tubes show up clearly on the X-rays. This procedure may be done:  To help determine whether there are tumors, scars (adhesions), or other abnormalities in the uterus.  To find out why a woman is unable to have children (infertile).  To make sure the fallopian tubes are completely blocked a few months after having certain tubal sterilization procedures. Tell a health care provider about:  Any allergies you have.  All medicines you are taking, including vitamins, herbs, eye drops, creams, and over-the-counter medicines.  Any problems you or family members have had with the use of anesthetic medicines.  Any blood disorders you have.  Any surgeries you have had.  Any medical conditions you have.  Whether you are pregnant or may be pregnant. What are the risks? Generally, this is a safe procedure. However, problems may occur, including:  Infection in the lining of the uterus (endometritis) or fallopian tubes (salpingitis).  Allergic reaction to medicines or dyes.  Risk of making a hole (perforation) in the uterus or fallopian tubes.  Damage to other structures or organs. What happens before the procedure?  Schedule the procedure after your menstrual period stops, but before your next ovulation. This is usually between day 5 and day 10 of your last period. Day 1 is the first day of your period.  Ask your health care provider about changing or stopping your regular medicines. This is especially important if you are taking diabetes medicines or blood thinners.  Empty your bladder before the procedure begins.  Plan to have someone take you home from the hospital or clinic. What happens during the procedure?   You may be given one of the following: ? A medicine to help you relax (sedative). ? An over-the-counter pain medicine.  You will lie down on your back and place your feet into footrests (stirrups).  A device called a speculum will be inserted into your vagina. This allows your health care provider to see inside your vagina through to your cervix.  Your cervix will be washed with a germ-killing soap.  A medicine may be injected into your cervix to numb it (local anesthesia).  A thin, flexible tube will be passed through your cervix into your uterus.  Contrast dye will be passed through the tube and into the uterus. Contrast dye may cause some cramping.  Several X-rays will be taken as the contrast dye spreads through the uterus and into the fallopian tubes.  The tube will be removed. The contrast dye will flow out through your vagina naturally. The procedure may vary among health care providers and hospitals. What happens after the procedure?  Most of the contrast dye will flow out of your vagina naturally. You may want to wear a sanitary pad.  You may have mild cramping and vaginal bleeding. This should go away after a short time.  Do not drive for 24 hours if you were given a sedative.  It is up to you to get the results of your procedure. Ask your health care provider, or the department that is doing the procedure, when your results will be ready. Summary  Hysterosalpingography is a procedure in which a woman's uterus and fallopian tubes are examined.  During this procedure, contrast dye is injected into the uterus  through the vagina and cervix. X-rays are then taken. The dye helps the uterus and fallopian tubes show up clearly on the X-rays.  Schedule the procedure after your menstrual period stops, but before your next ovulation. This is usually between day 5 and day 10 of your last period.  After the procedure, you may have mild cramping and vaginal bleeding. This should go  away after a short time. This information is not intended to replace advice given to you by your health care provider. Make sure you discuss any questions you have with your health care provider. Document Released: 05/16/2004 Document Revised: 03/26/2017 Document Reviewed: 05/06/2016 Elsevier Patient Education  2020 [place]. Hysterosalpingography, Care After This sheet gives you information about how to care for yourself after your procedure. Your health care provider may also give you more specific instructions. If you have problems or questions, contact your health care provider. What can I expect after the procedure? After the procedure, it is common to have:  Cramping and light vaginal bleeding (spotting) for a short time.  Contrast dye naturally flowing out of your vagina. This fluid will be sticky and may have blood in it. You may want to wear a sanitary pad. Do not use a tampon.  Mild dizziness or nausea. Follow these instructions at home:   Do not have sexual intercourse, use a tampon, or douche until your health care provider approves.  Do not drive for 24 hours if you were given a medicine to help you relax (sedative).  Take over-the-counter and prescription medicines only as told by your health care provider.  Keep all follow-up visits as told by your health care provider. This is important. Contact a health care provider if:  You have a fever or chills.  You faint.  You have severe cramping.  Your skin has a rash, and is itchy or swollen.  You have a bad-smelling vaginal discharge.  You have vaginal bleeding that lasts for more than 4 days. Get help right away if:  You have nausea and vomiting.  You have heavy vaginal bleeding that soaks more than one pad every hour.  You have severe abdominal pain. Summary  After the procedure, you may have a discharge, cramping, and light vaginal bleeding (spotting) for a short time.  Do not have sexual intercourse,  use a tampon, or douche until your health care provider approves. This information is not intended to replace advice given to you by your health care provider. Make sure you discuss any questions you have with your health care provider. Document Released: 07/06/2011 Document Revised: 03/26/2017 Document Reviewed: 05/06/2016 Elsevier Patient Education  2020 [employer]. Myomectomy  Myomectomy is a surgery in which a non-cancerous fibroid (myoma) is removed from the uterus. Myomas are tumors made up of fibrous tissue. They are often called fibroid tumors. Fibroid tumors can range from the size of a pea to the size of a grapefruit. In a myomectomy, the fibroid tumor is removed without removing the uterus. Because these tumors are rarely cancerous, this surgery is usually done only if the tumor is growing or causing symptoms such as pain, pressure, bleeding, or pain with intercourse. Tell a health care provider about:  Any allergies you have.  All medicines you are taking, including vitamins, herbs, eye drops, creams, and over-the-counter medicines.  Any problems you or family members have had with anesthetic medicines.  Any blood disorders you have.  Any surgeries you have had.  Any medical conditions you have. What are  the risks? Generally, this is a safe procedure. However, problems may occur, including:  Bleeding.  Infection.  Allergic reactions to medicines.  Damage to other structures or organs.  Blood clots in the legs, chest, or brain.  Scar tissue on other organs and in the pelvis. This may require another surgery to remove the scar tissue. What happens before the procedure? Staying hydrated Follow instructions from your health care provider about hydration, which may include:  Up to 2 hours before the procedure - you may continue to drink clear liquids, such as water, clear fruit juice, black coffee, and plain tea. Eating and drinking restrictions Follow instructions  from your health care provider about eating and drinking, which may include:  8 hours before the procedure - stop eating heavy meals or foods such as meat, fried foods, or fatty foods.  6 hours before the procedure - stop eating light meals or foods, such as toast or cereal.  6 hours before the procedure - stop drinking milk or drinks that contain milk.  2 hours before the procedure - stop drinking clear liquids. General instructions  Ask your health care provider about: ? Changing or stopping your regular medicines. This is especially important if you are taking diabetes medicines or blood thinners. ? Taking medicines such as aspirin and ibuprofen. These medicines can thin your blood. Do not take these medicines before your procedure if your health care provider instructs you not to.  Do not drink alcohol the day before the surgery.  Do not use any products that contain nicotine or tobacco, such as cigarettes and e-cigarettes, for 2 weeks before the procedure. If you need help quitting, ask your health care provider.  Plan to have someone take you home from the hospital or clinic. Also arrange for someone to help you with activities during your recovery. What happens during the procedure?  To reduce your risk of infection: ? Your health care team will wash or sanitize their hands. ? Your skin will be washed with soap. ? Hair may be removed from the surgical area.  An IV tube will be inserted into one of your veins. Medicines will be able to flow directly into your body through this IV tube.  You will be given one or more of the following: ? A medicine to help you relax (sedative). ? A medicine to make you fall asleep (general anesthetic).  Small monitors will be attached to your body. They will be used to check your heart, blood pressure, and oxygen level.  A breathing tube will be placed into your lungs during the procedure.  A thin, flexible tube (catheter) will be inserted  into your bladder to collect urine.  Your surgeon will use one of the following methods to perform the procedure. The method used will depend on the size, shape, location, and number of fibroids. Hysteroscopic myomectomy This method may be used when the fibroid tumor is inside the cavity of the uterus. A long, thin tube with a lens (hysteroscope) will be inserted into the uterus through the vagina. A saline solution will be put into the uterus. This will expand the uterus and allow the surgeon to see the fibroids. Tools will be passed through the hysteroscope to remove the fibroid tumor in pieces. Laparoscopic myomectomy A few small incisions will be made in the lower abdomen. A thin, lighted tube with a camera (laparoscope) will be inserted through one of the incisions. This will give the surgeon a good view of the area.  The fibroid tumor will be removed through the other incisions. The incisions will then be closed with stitches (sutures) or staples. Abdominal myomectomy This method is used when the fibroid tumor cannot be removed with a hysteroscope or laparoscope. The surgery will be done through a larger surgical incision in the abdomen. The fibroid tumor will be removed through this incision. The incision will be closed with sutures or staples. Recovery time will be longer if this method is used. The procedures may vary among health care providers and hospitals. What happens after the procedure?  Your blood pressure, heart rate, breathing rate, and blood oxygen level will be monitored until the medicines you were given have worn off.  The IV access tube and catheter will remain on your body for a period of time.  You may be given medicine for pain or to help you sleep.  You may be given an antibiotic medicine if needed.  Do not drive for 24 hours if you were given a sedative. Summary  Myomectomy is surgery to remove a noncancerous fibroid (myoma) from the uterus.  This surgery is  usually done only if the tumor is growing or causing symptoms such as pain, pressure, bleeding, or pain during intercourse.  Follow instructions from your health care provider about eating and drinking before the procedure.  Recovery time from this procedure depends on the method. The abdominal method will require a longer recovery time. This information is not intended to replace advice given to you by your health care provider. Make sure you discuss any questions you have with your health care provider. Document Released: 02/08/2007 Document Revised: 08/05/2018 Document Reviewed: 05/14/2016 Elsevier Patient Education  2020 [place] After This sheet gives you information about how to care for yourself after your procedure. Your health care provider may also give you more specific instructions. If you have problems or questions, contact your health care provider. What can I expect after the procedure? After the procedure, it is common to have:  Pain in your abdomen, especially at the incision areas. You will be given pain medicine to control the pain.  Tiredness. This is a normal part of the recovery process. Your energy level will return to normal over the coming weeks.  Vaginal bleeding. This is normal and will stop in the coming weeks.  Constipation. Recovery time from this procedure will depend on the type of procedure you had and your general overall health prior to the procedure. Follow these instructions at home: Medicines  Take over-the-counter and prescription medicines only as told by your health care provider.  Do not take aspirin because it can cause bleeding.  If you were prescribed an antibiotic medicine, use it as told by your health care provider. Do not stop using the antibiotic even if you start to feel better.  Do not drive or use heavy machinery while taking prescription pain medicine.  Do not drink alcohol while taking prescription pain  medicine. Incision care   Follow instructions from your health care provider about how to take care of any incisions. Make sure you: ? Wash your hands with soap and water before you change your bandage (dressing). If soap and water are not available, use hand sanitizer. ? Change your dressing as told by your health care provider. ? Leave stitches (sutures), skin glue, or adhesive strips in place. These skin closures may need to stay in place for 2 weeks or longer. If adhesive strip edges start to loosen and curl up,  you may trim the loose edges. Do not remove adhesive strips completely unless your health care provider tells you to do that.  Check your incision areas every day for signs of infection. Check for: ? Redness, swelling, or pain. ? Fluid or blood. ? Warmth. ? Pus or a bad smell.  Do not take baths, swim, or use a hot tub until your health care provider approves. Take showers as directed by your health care provider. Activity  Return to your normal activities as told by your health care provider. Ask your health care provider what activities are safe for you.  Do not do activities that require a lot of effort until your health care provider says it is okay.  Do not lift anything that is heavier than 15 lb (6.8 kg) until your health care provider says that it is safe.  Do not douche, use tampons, or have sexual intercourse until your health care provider approves.  Walk daily but take frequent rest breaks if you tire easily.  Continue to practice deep breathing and coughing. If it hurts to cough, try holding a pillow against your belly as you cough.  Do not drive until your health care provider approves. General instructions  To prevent or treat constipation while you are taking prescription pain medicine, your health care provider may recommend that you: ? Drink enough fluid to keep your urine clear or pale yellow. ? Take over-the-counter or prescription medicines. ? Eat  foods that are high in fiber, such as fresh fruits and vegetables, whole grains, and beans. ? Limit foods that are high in fat and processed sugars, such as fried and sweet foods.  Take your temperature twice a day and write it down. If you develop a fever, this may be a sign that you have an infection.  Do not drink alcohol.  Have someone help you at home for 1 week or until you can do your own household activities.  Keep all follow-up visits as told by your health care provider. This is important. Contact a health care provider if:  You have a fever.  You have increasing abdominal pain that is not relieved with medicine.  You have nausea, vomiting, or diarrhea.  You have pain when you urinate or you have blood in your urine.  You have a rash on your body.  You have pain or redness where your IV access tube was inserted.  You have redness, swelling, or pain around an incision.  You have fluid or blood coming from an incision.  An incision feels warm to the touch.  You have pus or a bad smell coming from an incision. Get help right away if:  You have weakness or light-headedness.  You have pain, swelling, or redness in your legs.  You have chest pain.  You faint.  You have shortness of breath.  You have heavy vaginal bleeding.  You have an incision that is opening up. Summary  Recovery time from this procedure will depend on the type of procedure you had and your general overall health prior to the procedure.  If you were prescribed an antibiotic medicine, use it as told by your health care provider. Do not stop using the antibiotic even if you start to feel better.  Do not douche, use tampons, or have sexual intercourse until your health care provider approves.  Return to your normal activities as told by your health care provider. Ask your health care provider what activities are safe for you. This  information is not intended to replace advice given to you by  your health care provider. Make sure you discuss any questions you have with your health care provider. Document Released: 09/03/2010 Document Revised: 03/26/2017 Document Reviewed: 05/14/2016 Elsevier Patient Education  2020 [employer].

## 2018-11-10 NOTE — Progress Notes (Signed)
History:  28 y.o. G0P0000 here today for review of [place]. Pt reports that since her last visit her AUB has improved. She reports that her last 2 cycles have been much lighter. She is not taking the Megace or other meds for bleeding.    The following portions of the patient's history were reviewed and updated as appropriate: allergies, current medications, past family history, past medical history, past social history, past surgical history and problem list.  Review of Systems:  Pertinent items are noted in HPI.    Objective:  Physical Exam Blood pressure 115/69, pulse 72, height 5\' 2"  (1.575 m), weight 158 lb (71.7 kg), last menstrual period 10/30/2018.  CONSTITUTIONAL: Well-developed, well-nourished female in no acute distress.  HENT:  Normocephalic, atraumatic EYES: Conjunctivae and EOM are normal. No scleral icterus.  NECK: Normal range of motion SKIN: Skin is warm and dry. No rash noted. Not diaphoretic.No pallor. [place]: Alert and oriented to person, place, and time. Normal coordination.  Pelvic exam: deferred Labs and Imaging US Pelvis Transvanginal Non-ob (tv Only)  Result Date: 10/19/2018 CLINICAL DATA:  Menorrhagia, pelvic pain and heavy bleeding for 2 months, history of fibroids EXAM: TRANSABDOMINAL AND TRANSVAGINAL ULTRASOUND OF PELVIS TECHNIQUE: Both transabdominal and transvaginal ultrasound examinations of the pelvis were performed. Transabdominal technique was performed for global imaging of the pelvis including uterus, ovaries, adnexal regions, and pelvic cul-de-sac. It was necessary to proceed with endovaginal exam following the transabdominal exam to visualize the endometrium. COMPARISON:  10/18/2018 FINDINGS: Uterus Measurements: 12.5 x 8.9 x 7.8 cm = volume: 451 mL. Appears enlarged and heterogeneous. Large anterior upper uterine mass 6.2 x 4.9 x 5.7 cm, intramural extending submucosal. Additional small leiomyoma at posterior mid uterus, intramural, 2.3 x 2.2 x 2.8 cm.  Endometrium Displaced by large leiomyoma at upper uterine segment. Measures up to 8 mm thick. No focal abnormality or endometrial fluid seen Right ovary Measurements: 4.2 x 2.4 x 3.7 cm = volume: 19.4. Dominant follicle versus corpus luteum. No additional masses Left ovary Measurements: 3.0 x 1.6 x 2.5 cm = volume: 6.1 mL. Normal morphology without mass Other findings Trace free pelvic fluid.  No adnexal masses otherwise seen. IMPRESSION: 2 large uterine leiomyomata, larger of which measures 6.2 cm in greatest size and extends submucosal at the anterior aspect of the upper uterine segment. Smaller leiomyoma is at the posterior mid uterus, intramural, 2.8 cm greatest diameter. No adnexal abnormalities. Electronically Signed   By: Lavonia Dana M.D.   On: 10/19/2018 10:55   US Pelvis (transabdominal Only)  Result Date: 10/19/2018 CLINICAL DATA:  Menorrhagia, pelvic pain and heavy bleeding for 2 months, history of fibroids EXAM: TRANSABDOMINAL AND TRANSVAGINAL ULTRASOUND OF PELVIS TECHNIQUE: Both transabdominal and transvaginal ultrasound examinations of the pelvis were performed. Transabdominal technique was performed for global imaging of the pelvis including uterus, ovaries, adnexal regions, and pelvic cul-de-sac. It was necessary to proceed with endovaginal exam following the transabdominal exam to visualize the endometrium. COMPARISON:  10/18/2018 FINDINGS: Uterus Measurements: 12.5 x 8.9 x 7.8 cm = volume: 451 mL. Appears enlarged and heterogeneous. Large anterior upper uterine mass 6.2 x 4.9 x 5.7 cm, intramural extending submucosal. Additional small leiomyoma at posterior mid uterus, intramural, 2.3 x 2.2 x 2.8 cm. Endometrium Displaced by large leiomyoma at upper uterine segment. Measures up to 8 mm thick. No focal abnormality or endometrial fluid seen Right ovary Measurements: 4.2 x 2.4 x 3.7 cm = volume: 19.4. Dominant follicle versus corpus luteum. No additional masses Left ovary Measurements: 3.0  x 1.6 x  2.5 cm = volume: 6.1 mL. Normal morphology without mass Other findings Trace free pelvic fluid.  No adnexal masses otherwise seen. IMPRESSION: 2 large uterine leiomyomata, larger of which measures 6.2 cm in greatest size and extends submucosal at the anterior aspect of the upper uterine segment. Smaller leiomyoma is at the posterior mid uterus, intramural, 2.8 cm greatest diameter. No adnexal abnormalities. Electronically Signed   By: Lavonia Dana M.D.   On: 10/19/2018 10:55    Assessment & Plan:  Uterine fibroids- leading to AUB which has recently improved. Pt is interested in getting pregnant. Is undecidedly on whether to have surgery before a pregnancy. She is interested in a myomectomy however, she is worried about her inability to conceive.  I have discussed with her options of an HSG prior to any procedure. We did not specifically address other issues concerning getting pregnant this visit. The focus was predominantly on the issue of the fibroids.   Pt wants to discuss this further with her partner. Recommend a semen analysis and initial eval for ovulation prior to surgery.   Total face-to-face time with patient was 15 min.  Greater than 50% was spent in counseling and coordination of care with the patient.   Mahonri Seiden L. Harraway-Smith, M.D., Cherlynn June

## 2018-11-13 ENCOUNTER — Encounter: Payer: Self-pay | Admitting: Obstetrics & Gynecology

## 2018-11-16 ENCOUNTER — Ambulatory Visit (INDEPENDENT_AMBULATORY_CARE_PROVIDER_SITE_OTHER): Payer: BC Managed Care – PPO | Admitting: Obstetrics & Gynecology

## 2018-11-16 ENCOUNTER — Encounter: Payer: Self-pay | Admitting: Obstetrics & Gynecology

## 2018-11-16 ENCOUNTER — Other Ambulatory Visit: Payer: Self-pay

## 2018-11-16 VITALS — BP 113/64 | HR 76 | Wt 153.0 lb

## 2018-11-16 DIAGNOSIS — N939 Abnormal uterine and vaginal bleeding, unspecified: Secondary | ICD-10-CM

## 2018-11-16 NOTE — Progress Notes (Signed)
History:  28 y.o. G0P0000 here today for c/o feeling weak and dizzy. Pt reports that this is how she felt before she had a transfusion last. Her LMP was July 5th.  She reports that she is taking Integra. She is not actively bleeding at present.   The following portions of the patient's history were reviewed and updated as appropriate: allergies, current medications, past family history, past medical history, past social history, past surgical history and problem list.  Review of Systems:  Pertinent items are noted in HPI.    Objective:  Physical Exam Blood pressure 113/64, pulse 76, weight 153 lb (69.4 kg), last menstrual period 10/30/2018, SpO2 98 %.  CONSTITUTIONAL: Well-developed, well-nourished female who is sl ill in appearance; pt is in NAD. Marland Kitchen  HENT:  Normocephalic, atraumatic EYES: Conjunctivae and EOM are normal. No scleral icterus.  NECK: Normal range of motion SKIN: Skin is warm and dry. No rash noted. Not diaphoretic.No pallor. [place]: Alert and oriented to person, place, and time. Normal coordination.    Labs and Imaging US Pelvis Transvanginal Non-ob (tv Only)  Result Date: 10/19/2018 CLINICAL DATA:  Menorrhagia, pelvic pain and heavy bleeding for 2 months, history of fibroids EXAM: TRANSABDOMINAL AND TRANSVAGINAL ULTRASOUND OF PELVIS TECHNIQUE: Both transabdominal and transvaginal ultrasound examinations of the pelvis were performed. Transabdominal technique was performed for global imaging of the pelvis including uterus, ovaries, adnexal regions, and pelvic cul-de-sac. It was necessary to proceed with endovaginal exam following the transabdominal exam to visualize the endometrium. COMPARISON:  10/18/2018 FINDINGS: Uterus Measurements: 12.5 x 8.9 x 7.8 cm = volume: 451 mL. Appears enlarged and heterogeneous. Large anterior upper uterine mass 6.2 x 4.9 x 5.7 cm, intramural extending submucosal. Additional small leiomyoma at posterior mid uterus, intramural, 2.3 x 2.2 x 2.8 cm.  Endometrium Displaced by large leiomyoma at upper uterine segment. Measures up to 8 mm thick. No focal abnormality or endometrial fluid seen Right ovary Measurements: 4.2 x 2.4 x 3.7 cm = volume: 19.4. Dominant follicle versus corpus luteum. No additional masses Left ovary Measurements: 3.0 x 1.6 x 2.5 cm = volume: 6.1 mL. Normal morphology without mass Other findings Trace free pelvic fluid.  No adnexal masses otherwise seen. IMPRESSION: 2 large uterine leiomyomata, larger of which measures 6.2 cm in greatest size and extends submucosal at the anterior aspect of the upper uterine segment. Smaller leiomyoma is at the posterior mid uterus, intramural, 2.8 cm greatest diameter. No adnexal abnormalities. Electronically Signed   By: Lavonia Dana M.D.   On: 10/19/2018 10:55   US Pelvis (transabdominal Only)  Result Date: 10/19/2018 CLINICAL DATA:  Menorrhagia, pelvic pain and heavy bleeding for 2 months, history of fibroids EXAM: TRANSABDOMINAL AND TRANSVAGINAL ULTRASOUND OF PELVIS TECHNIQUE: Both transabdominal and transvaginal ultrasound examinations of the pelvis were performed. Transabdominal technique was performed for global imaging of the pelvis including uterus, ovaries, adnexal regions, and pelvic cul-de-sac. It was necessary to proceed with endovaginal exam following the transabdominal exam to visualize the endometrium. COMPARISON:  10/18/2018 FINDINGS: Uterus Measurements: 12.5 x 8.9 x 7.8 cm = volume: 451 mL. Appears enlarged and heterogeneous. Large anterior upper uterine mass 6.2 x 4.9 x 5.7 cm, intramural extending submucosal. Additional small leiomyoma at posterior mid uterus, intramural, 2.3 x 2.2 x 2.8 cm. Endometrium Displaced by large leiomyoma at upper uterine segment. Measures up to 8 mm thick. No focal abnormality or endometrial fluid seen Right ovary Measurements: 4.2 x 2.4 x 3.7 cm = volume: 19.4. Dominant follicle versus corpus luteum. No  additional masses Left ovary Measurements: 3.0 x 1.6 x  2.5 cm = volume: 6.1 mL. Normal morphology without mass Other findings Trace free pelvic fluid.  No adnexal masses otherwise seen. IMPRESSION: 2 large uterine leiomyomata, larger of which measures 6.2 cm in greatest size and extends submucosal at the anterior aspect of the upper uterine segment. Smaller leiomyoma is at the posterior mid uterus, intramural, 2.8 cm greatest diameter. No adnexal abnormalities. Electronically Signed   By: Lavonia Dana M.D.   On: 10/19/2018 10:55    Assessment & Plan:  H/o chronic anemia.   CBC today  Fereheme transfusion.   Total face-to-face time with patient was 5 min.  Greater than 50% was spent in counseling and coordination of care with the patient.   Berdine Rasmusson L. Harraway-Smith, M.D., Cherlynn June

## 2018-11-16 NOTE — Progress Notes (Signed)
Patient has not had any bleeding since her period on 10/30/2018. Patient sent to lab for CBC. [name] RN

## 2018-11-17 ENCOUNTER — Other Ambulatory Visit: Payer: Self-pay | Admitting: Obstetrics & Gynecology

## 2018-11-17 LAB — CBC
Hematocrit: 27 % — ABNORMAL LOW (ref 34.0–46.6)
Hemoglobin: 7.7 g/dL — ABNORMAL LOW (ref 11.1–15.9)
MCH: 18.6 pg — ABNORMAL LOW (ref 26.6–33.0)
MCHC: 28.5 g/dL — ABNORMAL LOW (ref 31.5–35.7)
MCV: 65 fL — ABNORMAL LOW (ref 79–97)
Platelets: 482 10*3/uL — ABNORMAL HIGH (ref 150–450)
RBC: 4.13 x10E6/uL (ref 3.77–5.28)
RDW: 17.8 % — ABNORMAL HIGH (ref 11.7–15.4)
WBC: 5.2 10*3/uL (ref 3.4–10.8)

## 2018-11-17 NOTE — Addendum Note (Signed)
Addended by: Lavonia Drafts on: 11/17/2018 10:02 AM   Modules accepted: Orders

## 2018-11-23 ENCOUNTER — Encounter (HOSPITAL_COMMUNITY): Payer: BC Managed Care – PPO

## 2018-11-29 ENCOUNTER — Other Ambulatory Visit: Payer: Self-pay | Admitting: Obstetrics & Gynecology

## 2018-11-29 ENCOUNTER — Telehealth: Payer: Self-pay

## 2018-11-29 DIAGNOSIS — N924 Excessive bleeding in the premenopausal period: Secondary | ICD-10-CM

## 2018-11-29 MED ORDER — TRANEXAMIC ACID 650 MG PO TABS: 1300.0000 mg | ORAL_TABLET | Freq: Three times a day (TID) | ORAL | 2 refills | Status: DC

## 2018-11-29 NOTE — Telephone Encounter (Signed)
Called pt to inform her of  Rx. Left message for pt to call the office back. Rose Berry l Kathelyn Gombos, CMA

## 2018-11-29 NOTE — Telephone Encounter (Signed)
-----   Message from Lavonia Drafts, MD sent at 11/29/2018  9:42 AM EDT ----- Please call pt. She can take Lysteda. Rx at pharmacy.   This is only to take while she is bleeding.   Clh-S

## 2018-12-01 ENCOUNTER — Ambulatory Visit (HOSPITAL_COMMUNITY)
Admission: RE | Admit: 2018-12-01 | Discharge: 2018-12-01 | Disposition: A | Discharge status: Home / Self Care | Payer: BC Managed Care – PPO | Source: Ambulatory Visit | Attending: Obstetrics & Gynecology | Admitting: Obstetrics & Gynecology

## 2018-12-01 ENCOUNTER — Encounter: Payer: Self-pay | Admitting: Obstetrics & Gynecology

## 2018-12-01 ENCOUNTER — Ambulatory Visit (INDEPENDENT_AMBULATORY_CARE_PROVIDER_SITE_OTHER): Payer: BC Managed Care – PPO | Admitting: Obstetrics & Gynecology

## 2018-12-01 ENCOUNTER — Other Ambulatory Visit: Payer: Self-pay

## 2018-12-01 VITALS — BP 114/64 | HR 91 | Ht 62.0 in | Wt 150.0 lb

## 2018-12-01 DIAGNOSIS — Z124 Encounter for screening for malignant neoplasm of cervix: Secondary | ICD-10-CM

## 2018-12-01 DIAGNOSIS — Z01419 Encounter for gynecological examination (general) (routine) without abnormal findings: Secondary | ICD-10-CM

## 2018-12-01 DIAGNOSIS — Z113 Encounter for screening for infections with a predominantly sexual mode of transmission: Secondary | ICD-10-CM | POA: Diagnosis not present

## 2018-12-01 DIAGNOSIS — D649 Anemia, unspecified: Secondary | ICD-10-CM | POA: Insufficient documentation

## 2018-12-01 DIAGNOSIS — A749 Chlamydial infection, unspecified: Secondary | ICD-10-CM

## 2018-12-01 DIAGNOSIS — D251 Intramural leiomyoma of uterus: Secondary | ICD-10-CM

## 2018-12-01 MED ORDER — SODIUM CHLORIDE 0.9 % IV SOLN
510.0000 mg | Freq: Once | INTRAVENOUS | Status: AC
  Administered 2018-12-01: 510 mg via INTRAVENOUS
  Filled 2018-12-01: qty 17

## 2018-12-01 NOTE — Telephone Encounter (Signed)
Patient informed of phone call about lysteda. [name] RN

## 2018-12-01 NOTE — Patient Instructions (Signed)
Tranexamic acid oral tablets What is this medicine? TRANEXAMIC ACID (TRAN ex AM ik AS id) slows down or stops blood clots from being broken down. This medicine is used to treat heavy monthly menstrual bleeding. This medicine may be used for other purposes; ask your health care provider or pharmacist if you have questions. COMMON BRAND NAME(S): Cyklokapron, Lysteda What should I tell my health care provider before I take this medicine? They need to know if you have any of these conditions:  bleeding in the brain  blood clotting problems  kidney disease  vision problems  an unusual allergic reaction to tranexamic acid, other medicines, foods, dyes, or preservatives  pregnant or trying to get pregnant  breast-feeding How should I use this medicine? Take this medicine by mouth with a glass of water. Follow the directions on the prescription label. Do not cut, crush, or chew this medicine. You can take it with or without food. If it upsets your stomach, take it with food. Take your medicine at regular intervals. Do not take it more often than directed. Do not stop taking except on your doctor's advice. Do not take this medicine until your period has started. Do not take it for more than 5 days in a row. Do not take this medicine when you do not have your period. Talk to your pediatrician regarding the use of this medicine in children. While this drug may be prescribed for female children as young as 71 years of age for selected conditions, precautions do apply. Overdosage: If you think you have taken too much of this medicine contact a poison control center or emergency room at once. NOTE: This medicine is only for you. Do not share this medicine with others. What if I miss a dose? If you miss a dose, take it when you remember, and then take your next dose at least 6 hours later. Do not take more than 2 tablets at a time to make up for missed doses. What may interact with this medicine? Do  not take this medicine with any of the following medications:  estrogens  birth control pills, patches, injections, rings or other devices that contain both an estrogen and a progestin This medicine may also interact with the following medications:  certain medicines used to help your blood clot  tretinoin (taken by mouth) This list may not describe all possible interactions. Give your health care provider a list of all the medicines, herbs, non-prescription drugs, or dietary supplements you use. Also tell them if you smoke, drink alcohol, or use illegal drugs. Some items may interact with your medicine. What should I watch for while using this medicine? Tell your doctor or healthcare professional if your symptoms do not start to get better or if they get worse. Tell your doctor or healthcare professional if you notice any eye problems while taking this medicine. Your doctor will refer you to an eye doctor who will examine your eyes. What side effects may I notice from receiving this medicine? Side effects that you should report to your doctor or health care professional as soon as possible:  allergic reactions like skin rash, itching or hives, swelling of the face, lips, or tongue  breathing difficulties  changes in vision  sudden or severe pain in the chest, legs, head, or groin  unusually weak or tired Side effects that usually do not require medical attention (report to your doctor or health care professional if they continue or are bothersome):  back pain  headache  muscle or joint aches  sinus and nasal problems  stomach pain  tiredness This list may not describe all possible side effects. Call your doctor for medical advice about side effects. You may report side effects to FDA at 1-800-FDA-1088. Where should I keep my medicine? Keep out of the reach of children. Store at room temperature between 15 and 30 degrees C (59 and 86 degrees F). Throw away any unused  medicine after the expiration date. NOTE: This sheet is a summary. It may not cover all possible information. If you have questions about this medicine, talk to your doctor, pharmacist, or health care provider.  2020 Elsevier/Gold Standard (2015-05-16 09:12:15)

## 2018-12-01 NOTE — Progress Notes (Signed)
Subjective:     Rose Berry is a 28 y.o. female here for a routine exam.  Current complaints: Pt cont to complain of excessively heavy bleeding with menses. She reports 5-7 day cycles with clots. No bleeding between her cycles.    Gynecologic History No LMP recorded. Contraception: none Last Pap: 01/07/2015. Results were: normal +trich  Last mammogram: n/a.   Obstetric History OB History  Gravida Para Term Preterm AB Living  0 0 0 0 0 0  SAB TAB Ectopic Multiple Live Births  0 0 0 0 0   The following portions of the patient's history were reviewed and updated as appropriate: allergies, current medications, past family history, past medical history, past social history, past surgical history and problem list.  Review of Systems Pertinent items are noted in HPI.    Objective:  BP 114/64   Pulse 91   Ht 5\' 2"  (1.575 m)   Wt 150 lb (68 kg)   BMI 27.44 kg/m  General Appearance:    Alert, cooperative, no distress, appears stated age  Head:    Normocephalic, without obvious abnormality, atraumatic  Eyes:    conjunctiva/corneas clear, EOM's intact, both eyes  Ears:    Normal external ear canals, both ears  Nose:   Nares normal, septum midline, mucosa normal, no drainage    or sinus tenderness  Throat:   Lips, mucosa, and tongue normal; teeth and gums normal  Neck:   Supple, symmetrical, trachea midline, no adenopathy;    thyroid:  no enlargement/tenderness/nodules  Back:     Symmetric, no curvature, ROM normal, no CVA tenderness  Lungs:     respirations unlabored  Chest Wall:    No tenderness or deformity   Heart:    Regular rate and rhythm  Breast Exam:    No tenderness, masses, or nipple abnormality  Abdomen:     Soft, non-tender, bowel sounds active all four quadrants,    no masses, no organomegaly  Genitalia:    Normal female without lesion, discharge or tenderness; the uterus is enlarged with fibroids 16 weeks sized and mobile      Extremities:   Extremities normal,  atraumatic, no cyanosis or edema  Pulses:   2+ and symmetric all extremities  Skin:   Skin color, texture, turgor normal, no rashes or lesions    Assessment:    Healthy female exam.   Menorrhagia due to fibroids. Pt remains undecided on how she wants to manage the fibroids.Reviewed tx options.  STI screen        Plan:   TXA 1 po tid prn heavy bleeding. Review use.  Integra 1 po q day  F/u PAP and cx, BV, trich

## 2018-12-02 ENCOUNTER — Encounter: Payer: Self-pay | Admitting: Obstetrics & Gynecology

## 2018-12-06 LAB — CYTOLOGY - PAP
Chlamydia: POSITIVE — AB
Diagnosis: NEGATIVE
Neisseria Gonorrhea: NEGATIVE
Trichomonas: NEGATIVE

## 2018-12-07 ENCOUNTER — Telehealth: Payer: Self-pay

## 2018-12-07 DIAGNOSIS — A749 Chlamydial infection, unspecified: Secondary | ICD-10-CM

## 2018-12-07 MED ORDER — AZITHROMYCIN 500 MG PO TABS: 1000.0000 mg | ORAL_TABLET | Freq: Every day | ORAL | 1 refills | Status: AC

## 2018-12-07 NOTE — Telephone Encounter (Signed)
Pt made aware that she tested positive for Chlamydia. Pt advised to not have intercourse for ten days after she and partner are treated. Zithromax 1000 mg with 1 refill for pt's partner was sent to pharmacy. Understanding was voiced. Ellyssa Zagal l [place], CMA

## 2018-12-07 NOTE — Telephone Encounter (Signed)
Called pt regarding positive Chlamydia results. Left message for pt to call the office back.Margia Wiesen l Kristiana Jacko, CMA

## 2018-12-08 NOTE — Telephone Encounter (Signed)
STD form was faxed to [facility].Ainslie Mazurek l Fadumo Heng, CMA

## 2018-12-09 MED ORDER — AZITHROMYCIN 500 MG PO TABS: 1000.0000 mg | ORAL_TABLET | Freq: Once | ORAL | 1 refills | Status: AC

## 2018-12-09 NOTE — Addendum Note (Signed)
Addended by: Lavonia Drafts on: 12/09/2018 08:49 AM   Modules accepted: Orders

## 2019-07-27 ENCOUNTER — Ambulatory Visit (INDEPENDENT_AMBULATORY_CARE_PROVIDER_SITE_OTHER): Payer: Self-pay | Admitting: Family Medicine

## 2019-07-27 ENCOUNTER — Encounter: Payer: Self-pay | Admitting: Family Medicine

## 2019-07-27 VITALS — Temp 97.4°F

## 2019-07-27 DIAGNOSIS — R519 Headache, unspecified: Secondary | ICD-10-CM

## 2019-07-27 MED ORDER — METOCLOPRAMIDE HCL 10 MG PO TABS: 10.0000 mg | ORAL_TABLET | Freq: Every day | ORAL | 0 refills | Status: DC | PRN

## 2019-07-27 MED ORDER — PREDNISONE 20 MG PO TABS: 40.0000 mg | ORAL_TABLET | Freq: Every day | ORAL | 0 refills | Status: AC

## 2019-07-27 NOTE — Progress Notes (Signed)
Chief Complaint  Patient presents with  . Migraine    for 7 days     Rose Berry is a 29 y.o. female here for evaluation of an acute headache. Due to COVID-19 pandemic, we are interacting via web portal for an electronic face-to-face visit. I verified patient's ID using 2 identifiers. Patient agreed to proceed with visit via this method. Patient is at home, I am at office. Patient and I are present for visit.   Duration: 6 days Quality: Achy, sharp Unknown trigger for this migraine Severity: 10/10 Associated symptoms: light sensitivity, poor concentration Therapies tried: heat, Tylenol, ice, compresses Hx of migraines: Yes  Past Medical History:  Diagnosis Date  . Anemia   . Asthma   . UTI (urinary tract infection)    Allergies as of 07/27/2019   No Known Allergies     Medication List       Accurate as of July 27, 2019  4:14 PM. If you have any questions, ask your nurse or doctor.        STOP taking these medications   megestrol 40 MG tablet Commonly known as: MEGACE Stopped by: Shelda Pal, DO   tranexamic acid 650 MG Tabs tablet Commonly known as: LYSTEDA Stopped by: Shelda Pal, DO     TAKE these medications   albuterol 108 (90 Base) MCG/ACT inhaler Commonly known as: VENTOLIN HFA Inhale 2 puffs into the lungs every 6 (six) hours as needed for wheezing or shortness of breath.   ferrous sulfate 325 (65 FE) MG tablet Take 1 tablet (325 mg total) by mouth 2 (two) times daily with a meal.   Integra F 125-1 MG Caps Take 1 capsule by mouth daily.   metoCLOPramide 10 MG tablet Commonly known as: Reglan Take 1 tablet (10 mg total) by mouth daily as needed for nausea. Started by: Shelda Pal, DO   Multivitamin Gummies [facility] 2 each by mouth daily.   predniSONE 20 MG tablet Commonly known as: DELTASONE Take 2 tablets (40 mg total) by mouth daily with breakfast for 5 days. Start taking on: July 28, 2019 Started  by: Shelda Pal, DO      Exam Temp (!) 97.4 F (36.3 C) (Oral)  No conversational dyspnea Age appropriate judgment and insight Nml affect and mood  Acute intractable headache, unspecified headache type - Plan: predniSONE (DELTASONE) 20 MG tablet, metoCLOPramide (REGLAN) 10 MG tablet  She is coming in for a Toradol injection, above meds starting tomorrow if it does not go away.  F/u prn. The pt voiced understanding and agreement to the plan.  [place], DO 07/27/19 4:14 PM

## 2019-08-17 ENCOUNTER — Other Ambulatory Visit: Payer: Self-pay

## 2019-08-17 DIAGNOSIS — N946 Dysmenorrhea, unspecified: Secondary | ICD-10-CM

## 2019-08-17 DIAGNOSIS — D219 Benign neoplasm of connective and other soft tissue, unspecified: Secondary | ICD-10-CM

## 2019-08-17 MED ORDER — INTEGRA F 125-1 MG PO CAPS: 1.0000 | ORAL_CAPSULE | Freq: Every day | ORAL | 2 refills | Status: DC

## 2020-01-29 ENCOUNTER — Encounter: Payer: Self-pay | Admitting: Obstetrics & Gynecology

## 2020-01-29 ENCOUNTER — Ambulatory Visit (INDEPENDENT_AMBULATORY_CARE_PROVIDER_SITE_OTHER): Payer: Self-pay | Admitting: Obstetrics & Gynecology

## 2020-01-29 ENCOUNTER — Other Ambulatory Visit: Payer: Self-pay

## 2020-01-29 VITALS — BP 101/61 | HR 89 | Ht 62.0 in | Wt 164.0 lb

## 2020-01-29 DIAGNOSIS — N939 Abnormal uterine and vaginal bleeding, unspecified: Secondary | ICD-10-CM

## 2020-01-29 DIAGNOSIS — D219 Benign neoplasm of connective and other soft tissue, unspecified: Secondary | ICD-10-CM

## 2020-01-29 DIAGNOSIS — D500 Iron deficiency anemia secondary to blood loss (chronic): Secondary | ICD-10-CM

## 2020-01-29 DIAGNOSIS — N921 Excessive and frequent menstruation with irregular cycle: Secondary | ICD-10-CM

## 2020-01-29 MED ORDER — MEGESTROL ACETATE 40 MG PO TABS: 40.0000 mg | ORAL_TABLET | Freq: Two times a day (BID) | ORAL | 2 refills | Status: DC

## 2020-01-29 NOTE — Progress Notes (Signed)
History:  29 y.o. G0P0000 here today for f/u of AUB. Pt reports that she now wants definitive management. She was seen in the ED and is s/p transfusion of 3 units of PRBCs. She feels better now. She reports that she was dizzy. Her menses are intermittently heavy with clots and light. She feels that they are improved on TXA.  She is also on the Integra.  She was seen 10/2019 in the ED at [facility]. Her Hgb at the [facility] ED was 4.   Her bleeding has not been as heavy. She reports that she feels like her fibroid have increase in size.  She has a new job.    The following portions of the patient's history were reviewed and updated as appropriate: allergies, current medications, past family history, past medical history, past social history, past surgical history and problem list.  Review of Systems:  Pertinent items are noted in HPI.    Objective:  Physical Exam Blood pressure 101/61, pulse 89, height 5\' 2"  (1.575 m), weight 164 lb (74.4 kg), last menstrual period 01/13/2020.  CONSTITUTIONAL: Well-developed, well-nourished female in no acute distress.  HENT:  Normocephalic, atraumatic EYES: Conjunctivae and EOM are normal. No scleral icterus.  NECK: Normal range of motion SKIN: Skin is warm and dry. No rash noted. Not diaphoretic.No pallor. [place]: Alert and oriented to person, place, and time. Normal coordination.  Pelvic: deferred  Labs and Imaging 10/18/2018 CLINICAL DATA:  Menorrhagia, pelvic pain and heavy bleeding for 2 months, history of fibroids  EXAM: TRANSABDOMINAL AND TRANSVAGINAL ULTRASOUND OF PELVIS  TECHNIQUE: Both transabdominal and transvaginal ultrasound examinations of the pelvis were performed. Transabdominal technique was performed for global imaging of the pelvis including uterus, ovaries, adnexal regions, and pelvic cul-de-sac. It was necessary to proceed with endovaginal exam following the transabdominal exam to visualize the endometrium.  COMPARISON:   10/18/2018  FINDINGS: Uterus  Measurements: 12.5 x 8.9 x 7.8 cm = volume: 451 mL. Appears enlarged and heterogeneous. Large anterior upper uterine mass 6.2 x 4.9 x 5.7 cm, intramural extending submucosal. Additional small leiomyoma at posterior mid uterus, intramural, 2.3 x 2.2 x 2.8 cm.  Endometrium  Displaced by large leiomyoma at upper uterine segment. Measures up to 8 mm thick. No focal abnormality or endometrial fluid seen  Right ovary  Measurements: 4.2 x 2.4 x 3.7 cm = volume: 19.4. Dominant follicle versus corpus luteum. No additional masses  Left ovary  Measurements: 3.0 x 1.6 x 2.5 cm = volume: 6.1 mL. Normal morphology without mass  Other findings  Trace free pelvic fluid.  No adnexal masses otherwise seen.  IMPRESSION: 2 large uterine leiomyomata, larger of which measures 6.2 cm in greatest size and extends submucosal at the anterior aspect of the upper uterine segment.  Smaller leiomyoma is at the posterior mid uterus, intramural, 2.8 cm greatest diameter.  No adnexal abnormalities.  For CBC: see Care Everywhere.  Assessment & Plan:   Diagnoses and all orders for this visit:  Abnormal uterine bleeding (AUB) -     megestrol (MEGACE) 40 MG tablet; Take 1 tablet (40 mg total) by mouth 2 (two) times daily. Can increase to two tablets twice a day in the event of heavy bleeding -     CBC  Fibroids -     megestrol (MEGACE) 40 MG tablet; Take 1 tablet (40 mg total) by mouth 2 (two) times daily. Can increase to two tablets twice a day in the event of heavy bleeding -     CBC  Menorrhagia with irregular cycle -     megestrol (MEGACE) 40 MG tablet; Take 1 tablet (40 mg total) by mouth 2 (two) times daily. Can increase to two tablets twice a day in the event of heavy bleeding -     CBC  Anemia due to chronic blood loss -     megestrol (MEGACE) 40 MG tablet; Take 1 tablet (40 mg total) by mouth 2 (two) times daily. Can increase to two tablets  twice a day in the event of heavy bleeding -     CBC  f/u in 2 months or sooner prn [place] after next visit.  Will further discus management at her next visit.  Keep integra daily.   Roniesha Hollingshead L. Harraway-Smith, M.D., Cherlynn June

## 2020-01-29 NOTE — Patient Instructions (Signed)
Megestrol tablets What is this medicine? MEGESTROL (me JES trol) belongs to a class of drugs known as progestins. Megestrol tablets are used to treat advanced breast or endometrial cancer. This medicine may be used for other purposes; ask your health care provider or pharmacist if you have questions. COMMON BRAND NAME(S): Megace What should I tell my health care provider before I take this medicine? They need to know if you have any of these conditions:  adrenal gland problems  history of blood clots of the legs, lungs, or other parts of the body  diabetes  kidney disease  liver disease  stroke  an unusual or allergic reaction to megestrol, other medicines, foods, dyes, or preservatives  pregnant or trying to get pregnant  breast-feeding How should I use this medicine? Take this medicine by mouth. Follow the directions on the prescription label. Do not take your medicine more often than directed. Take your doses at regular intervals. Do not stop taking except on the advice of your doctor or health care professional. Talk to your pediatrician regarding the use of this medicine in children. Special care may be needed. Overdosage: If you think you have taken too much of this medicine contact a poison control center or emergency room at once. NOTE: This medicine is only for you. Do not share this medicine with others. What if I miss a dose? If you miss a dose, take it as soon as you can. If it is almost time for your next dose, take only that dose. Do not take double or extra doses. What may interact with this medicine? Do not take this medicine with any of the following medications:  dofetilide This medicine may also interact with the following medications:  indinavir This list may not describe all possible interactions. Give your health care provider a list of all the medicines, herbs, non-prescription drugs, or dietary supplements you use. Also tell them if you smoke, drink  alcohol, or use illegal drugs. Some items may interact with your medicine. What should I watch for while using this medicine? Visit your doctor or health care professional for regular checks on your progress. Continue taking this medicine even if you feel better. It may take 2 months of regular use before you know if this medicine is working for your condition. This medicine can cause birth defects. Do not get pregnant while taking this drug. Females with child-bearing potential will need to have a negative pregnancy test before starting this medicine. Use an effective method of birth control while you are taking this medicine. If you think that you might be pregnant talk to your doctor right away. If you have diabetes, this medicine may affect blood sugar levels. Check your blood sugar and talk to your doctor or health care professional if you notice changes. What side effects may I notice from receiving this medicine? Side effects that you should report to your doctor or health care professional as soon as possible: Side effects that you should report to your doctor or health care professional as soon as possible:  allergic reactions like skin rash, itching or hives, swelling of the face, lips, or tongue  breathing problems  dizziness  increased blood pressure  signs and symptoms of a blood clot such as breathing problems; changes in vision; chest pain; severe, sudden headache; pain, swelling, warmth in the leg; trouble speaking; sudden numbness or weakness of the face, arm or leg  swelling of the ankles, feet, hands  unusually weak or tired  vomiting Side effects that usually do not require medical attention (report to your doctor or health care professional if they continue or are bothersome):  breakthrough menstrual bleeding  changes in sex drive or performance  diarrhea  gas  hot flashes or flushing  increased appetite  upset stomach  weight gain This list may not  describe all possible side effects. Call your doctor for medical advice about side effects. You may report side effects to FDA at 1-800-FDA-1088. Where should I keep my medicine? Keep out of the reach of children. Store at controlled room temperature between 15 and 30 degrees C (59 and 86 degrees F). Protect from heat above 40 degrees C (104 degrees F). Throw away any unused medicine after the expiration date. NOTE: This sheet is a summary. It may not cover all possible information. If you have questions about this medicine, talk to your doctor, pharmacist, or health care provider.  2020 Elsevier/Gold Standard (2017-04-21 10:07:34)

## 2020-01-29 NOTE — Progress Notes (Signed)
Follow up for ED admission at [facility]: "In the emergency room her initial evaluation included a complete blood count which revealed a hemoglobin of 4.9. She was typed and screened and PRBC transfusion has been ordered. Admission is requested for severe symptomatic anemia. Anemia is likely due to known fibroids, excessive bleeding with menstrual and non compliance with prescribed treatment"

## 2020-01-30 LAB — CBC
Hematocrit: 26.8 % — ABNORMAL LOW (ref 34.0–46.6)
Hemoglobin: 7.6 g/dL — ABNORMAL LOW (ref 11.1–15.9)
MCH: 19 pg — ABNORMAL LOW (ref 26.6–33.0)
MCHC: 28.4 g/dL — ABNORMAL LOW (ref 31.5–35.7)
MCV: 67 fL — ABNORMAL LOW (ref 79–97)
Platelets: 316 10*3/uL (ref 150–450)
RBC: 3.99 x10E6/uL (ref 3.77–5.28)
RDW: 19.4 % — ABNORMAL HIGH (ref 11.7–15.4)
WBC: 3.5 10*3/uL (ref 3.4–10.8)

## 2020-04-03 ENCOUNTER — Other Ambulatory Visit: Payer: Self-pay

## 2020-04-03 ENCOUNTER — Ambulatory Visit (INDEPENDENT_AMBULATORY_CARE_PROVIDER_SITE_OTHER): Payer: Self-pay | Admitting: Medical

## 2020-04-03 ENCOUNTER — Encounter: Payer: Self-pay | Admitting: Medical

## 2020-04-03 VITALS — BP 122/65 | HR 72 | Resp 18 | Ht 62.0 in | Wt 164.0 lb

## 2020-04-03 DIAGNOSIS — R5383 Other fatigue: Secondary | ICD-10-CM

## 2020-04-03 DIAGNOSIS — D500 Iron deficiency anemia secondary to blood loss (chronic): Secondary | ICD-10-CM

## 2020-04-03 DIAGNOSIS — R519 Headache, unspecified: Secondary | ICD-10-CM

## 2020-04-03 DIAGNOSIS — F419 Anxiety disorder, unspecified: Secondary | ICD-10-CM

## 2020-04-03 DIAGNOSIS — D619 Aplastic anemia, unspecified: Secondary | ICD-10-CM

## 2020-04-03 DIAGNOSIS — R0789 Other chest pain: Secondary | ICD-10-CM

## 2020-04-03 DIAGNOSIS — D219 Benign neoplasm of connective and other soft tissue, unspecified: Secondary | ICD-10-CM

## 2020-04-03 LAB — CBC WITH DIFFERENTIAL/PLATELET
Absolute Monocytes: 306 cells/uL (ref 200–950)
Basophils Absolute: 19 cells/uL (ref 0–200)
Basophils Relative: 0.4 %
Eosinophils Absolute: 19 cells/uL (ref 15–500)
Eosinophils Relative: 0.4 %
HCT: 20.5 % — ABNORMAL LOW (ref 35.0–45.0)
Hemoglobin: 5.4 g/dL — CL (ref 11.7–15.5)
Lymphs Abs: 1871 cells/uL (ref 850–3900)
MCH: 15.7 pg — ABNORMAL LOW (ref 27.0–33.0)
MCHC: 26.3 g/dL — ABNORMAL LOW (ref 32.0–36.0)
MCV: 59.8 fL — ABNORMAL LOW (ref 80.0–100.0)
MPV: 10.5 fL (ref 7.5–12.5)
Monocytes Relative: 6.5 %
Neutro Abs: 2486 cells/uL (ref 1500–7800)
Neutrophils Relative %: 52.9 %
Platelets: 557 10*3/uL — ABNORMAL HIGH (ref 140–400)
RBC: 3.43 10*6/uL — ABNORMAL LOW (ref 3.80–5.10)
RDW: 19.4 % — ABNORMAL HIGH (ref 11.0–15.0)
Total Lymphocyte: 39.8 %
WBC: 4.7 10*3/uL (ref 3.8–10.8)

## 2020-04-03 LAB — CBC MORPHOLOGY

## 2020-04-03 LAB — TROPONIN I: Troponin I: <3 ng/L (ref <=47)

## 2020-04-03 MED ORDER — KETOROLAC TROMETHAMINE 60 MG/2ML IM SOLN
60.0000 mg | Freq: Once | INTRAMUSCULAR | Status: AC
  Administered 2020-04-03: 60 mg via INTRAMUSCULAR

## 2020-04-03 NOTE — Patient Instructions (Addendum)
History of intermittent recurrent headaches that do sound migraine-like.  For headache today we will give Toradol 60 mg IM injection.  We will go ahead and prescribe Imitrex 50 mg tablets.  Explained 1 tablet at onset of migraine-like headache and repeat 2 hours as needed.  Max tablets would be to tablets within a 24-hour period.  History of fibroids with chronic anemia.  Will get CBC and iron today.  Chronically runs severe anemia in the 7 range.  But if less than 7 hemoglobin then would need to consider ED evaluation. Level 6 6 hemoglobin usually necessitates transfusion.  Atypical chest pain yesterday for 2 hours.  This occurred after a panic attack.  For caution sake get EKG.  EKG showed normal sinus rhythm.(v1 t wave negative and was neg in 2017. Today v3 t wave negative.  If any recurrent severe type chest pain then have to recommend ED evaluation. Pain was yesterday. Will do one stat troponin.  Recent high-level anxiety.  Did offer medication called BuSpar.  You declined presently.  But let me know if you change your mind.  Fatigue recently likely from anemia.  But will add metabolic panel to lab work.  Follow-up in 7 days or as needed.

## 2020-04-03 NOTE — Addendum Note (Signed)
Addended by: Jeronimo Greaves on: 04/03/2020 04:07 PM   Modules accepted: Orders

## 2020-04-03 NOTE — Progress Notes (Signed)
Subjective:    Patient ID: Rose Berry, female    DOB: [birth year], 29 y.o.   MRN: [id number]  HPI  Pt in with slight ha today. She states pain was worse 2 days ago. But now ha is less.   Pt states headaches started around June. 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 She works 10 hour days. HA sometime worsens looking at computer. Some nausea today with mild ha. Recently used excedrin migraine for ha. Notes when gets ha will purposefully get in dark room since light sensitive. Not sound sensitive. No hx of migraine ha.  Current level ha 5/10.  Since summer gets ha at least 1-2 times a month.   Pt has hx of anemia. She has hx of fibroids. Pt states she wants to have surgery. Pt in past had transfusions due anemia. Last transfusion this summer.  Pt notes also feeling very anxious. Yesterday had severe anxiety and panic attack type sensation. After panic attack had mild chest pain for 2 hours.   Pt seeing gyn on 77 th of January.      Review of Systems  Constitutional: Positive for fatigue. Negative for chills and fever.       Mild tired.  Respiratory: Negative for cough, chest tightness, shortness of breath and wheezing.   Cardiovascular: Negative for chest pain and palpitations.  Gastrointestinal: Positive for nausea. Negative for abdominal pain, constipation and vomiting.  Musculoskeletal: Negative for back pain, myalgias and neck stiffness.  Skin: Negative for rash.  Neurological: Positive for headaches.       Low level currently.  Hematological: Negative for adenopathy. Does not bruise/bleed easily.  Psychiatric/Behavioral: Positive for confusion. Negative for behavioral problems.    Past Medical History:  Diagnosis Date  . Anemia   . Asthma   . UTI (urinary tract infection)      Social History   Socioeconomic History  . Marital status: Single    Spouse name: Not on file  . Number of children: Not on file  . Years of education: Not  on file  . Highest education level: Not on file  Occupational History  . Not on file  Tobacco Use  . Smoking status: Never Smoker  . Smokeless tobacco: Never Used  Substance and Sexual Activity  . Alcohol use: No  . Drug use: No  . Sexual activity: Not Currently    Birth control/protection: Condom  Other Topics Concern  . Not on file  Social History Narrative  . Not on file   Social Determinants of Health   Financial Resource Strain:   . Difficulty of Paying Living Expenses: Not on file  Food Insecurity:   . Worried About Charity fundraiser in the Last Year: Not on file  . Ran Out of Food in the Last Year: Not on file  Transportation Needs:   . Lack of Transportation (Medical): Not on file  . Lack of Transportation (Non-Medical): Not on file  Physical Activity:   . Days of Exercise per Week: Not on file  . Minutes of Exercise per Session: Not on file  Stress:   . Feeling of Stress : Not on file  Social Connections:   . Frequency of Communication with Friends and Family: Not on file  . Frequency of Social Gatherings with Friends and Family: Not on file  . Attends Religious Services: Not on file  . Active Member of Clubs or Organizations: Not on file  . Attends Archivist  Meetings: Not on file  . Marital Status: Not on file  Intimate Partner Violence:   . Fear of Current or Ex-Partner: Not on file  . Emotionally Abused: Not on file  . Physically Abused: Not on file  . Sexually Abused: Not on file    No past surgical history on file.  Family History  Problem Relation Age of Onset  . Arthritis Mother   . Hypertension Mother   . Hyperlipidemia Maternal Grandmother   . Hypertension Maternal Grandmother   . Diabetes Maternal Grandmother   . Kidney disease Maternal Grandmother   . Hyperlipidemia Maternal Grandfather   . Hypertension Maternal Grandfather   . Diabetes Maternal Grandfather   . Kidney disease Maternal Grandfather   . Hyperlipidemia Paternal  Grandmother   . Hypertension Paternal Grandmother   . Diabetes Paternal Grandmother   . Kidney disease Paternal Grandmother   . Hyperlipidemia Paternal Grandfather   . Hypertension Paternal Grandfather   . Diabetes Paternal Grandfather   . Kidney disease Paternal Grandfather     No Known Allergies  Current Outpatient Medications on File Prior to Visit  Medication Sig Dispense Refill  . albuterol (PROVENTIL HFA;VENTOLIN HFA) 108 (90 Base) MCG/ACT inhaler Inhale 2 puffs into the lungs every 6 (six) hours as needed for wheezing or shortness of breath. 1 Inhaler 0  . Fe Fum-FePoly-FA-Vit C-Vit B3 (INTEGRA F) 125-1 MG CAPS Take 1 capsule by mouth daily. 30 capsule 2  . ferrous sulfate 325 (65 FE) MG tablet Take 1 tablet (325 mg total) by mouth 2 (two) times daily with a meal. (Patient not taking: Reported on 01/29/2020) 60 tablet 3  . megestrol (MEGACE) 40 MG tablet Take 1 tablet (40 mg total) by mouth 2 (two) times daily. Can increase to two tablets twice a day in the event of heavy bleeding 90 tablet 2  . metoCLOPramide (REGLAN) 10 MG tablet Take 1 tablet (10 mg total) by mouth daily as needed for nausea. (Patient not taking: Reported on 01/29/2020) 5 tablet 0  . Multiple Vitamins-Minerals (MULTIVITAMIN GUMMIES WOMENS) CHEW Chew 2 each by mouth daily. (Patient not taking: Reported on 01/29/2020)     No current facility-administered medications on file prior to visit.    BP 122/65   Pulse 72   Resp 18   Ht 5\' 2"  (1.575 m)   Wt 164 lb (74.4 kg)   LMP 03/21/2020   SpO2 96%   BMI 30.00 kg/m       Objective:   Physical Exam   General Mental Status- Alert. General Appearance- Not in acute distress.   Skin General: Color- Normal Color. Moisture- Normal Moisture.  Neck Carotid Arteries- Normal color. Moisture- Normal Moisture. No carotid bruits. No JVD.  Chest and Lung Exam Auscultation: Breath Sounds:-Normal.  Cardiovascular Auscultation:Rythm- Regular. Murmurs & Other Heart  Sounds:Auscultation of the heart reveals- No Murmurs.  Abdomen Inspection:-Inspeection Normal. Palpation/Percussion:Note:No mass. Palpation and Percussion of the abdomen reveal- Non Tender, Non Distended + BS, no rebound or guarding.   Neurologic Cranial Nerve exam:- CN III-XII intact(No nystagmus), symmetric smile. Drift Test:- No drift. Romberg Exam:- Negative.  Heal to Toe Gait exam:-Normal. Finger to Nose:- Normal/Intact Strength:- 5/5 equal and symmetric strength both upper and lower extremities.     Assessment & Plan:   [phone]  History of intermittent recurrent headaches that do sound migraine-like.  For headache today we will give Toradol 60 mg IM injection.  We will go ahead and prescribe Imitrex 50 mg tablets.  Explained 1 tablet  at onset of migraine-like headache and repeat 2 hours as needed.  Max tablets would be to tablets within a 24-hour period.  History of fibroids with chronic anemia.  Will get CBC and iron today.  Chronically runs severe anemia in the 7 range.  But if less than 7 hemoglobin then would need to consider ED evaluation. Level 6 6 hemoglobin usually necessitates transfusion.  Atypical chest pain yesterday for 2 hours.  This occurred after a panic attack.  For caution sake get EKG.  EKG showed normal sinus rhythm.  If any recurrent severe type chest pain then have to recommend ED evaluation.  Recent high-level anxiety.  Did offer medication called BuSpar.  You declined presently.  But let me know if you change your mind.  Fatigue recently likely from anemia.  But will add metabolic panel to lab work.  Follow-up in 7 days or as needed.  Mackie Pai, PA-C   Time spent with patient today was 40 minutes which consisted of chart review, discussing diagnosis, work up treatment and documentation.

## 2020-04-03 NOTE — Addendum Note (Signed)
Addended by: Kelle Darting A on: 04/03/2020 04:10 PM   Modules accepted: Orders

## 2020-04-04 ENCOUNTER — Telehealth: Payer: Self-pay

## 2020-04-04 LAB — COMPREHENSIVE METABOLIC PANEL
ALT: 9 U/L (ref 0–35)
AST: 15 U/L (ref 0–37)
Albumin: 4.2 g/dL (ref 3.5–5.2)
Alkaline Phosphatase: 52 U/L (ref 39–117)
BUN: 7 mg/dL (ref 6–23)
CO2: 27 mEq/L (ref 19–32)
Calcium: 9.3 mg/dL (ref 8.4–10.5)
Chloride: 105 mEq/L (ref 96–112)
Creatinine, Ser: 0.75 mg/dL (ref 0.40–1.20)
GFR: 106.5 mL/min (ref >60.00)
Glucose, Bld: 86 mg/dL (ref 70–99)
Potassium: 4.1 mEq/L (ref 3.5–5.1)
Sodium: 139 mEq/L (ref 135–145)
Total Bilirubin: 0.7 mg/dL (ref 0.2–1.2)
Total Protein: 7.8 g/dL (ref 6.0–8.3)

## 2020-04-04 LAB — IRON: Iron: 7 ug/dL — ABNORMAL LOW (ref 42–145)

## 2020-04-04 NOTE — Telephone Encounter (Signed)
Nurse Assessment Nurse: D'Heur Lucia Gaskins, RN, [place] Date/Time (Eastern Time): 04/03/2020 6:17:10 PM Is there an on-call provider listed? ---Yes Please list name of person reporting value (Lab Employee) and a contact number. ---Charlton Amor, [phone] Please document the following items: Lab name Lab value (read back to lab to verify) Reference range for lab value Date and time blood was drawn Collect time of birth for bilirubin results ---[facility] HCT-20.5 (Normal 35.0-45.0) HGB 5.4 (Normal 11.7-15.5) Drawn 04/03/20 @ 4:05 p.m. Please collect the patient contact information from the lab. (name, phone number and address) ---Rinaldo Ratel, [phone]; Boykin Nearing, [place]. Time Eilene Ghazi Time) Disposition Final User 04/03/2020 6:29:18 PM Called On-Call Provider D'Heur Lucia Gaskins, RN, [place] 04/03/2020 6:30:23 PM Attempt made - no message left D'Heur Macky Lower 04/03/2020 6:34:45 PM Lab Call D'Heur Lucia Gaskins, RN, Vincente Liberty Reason: Critical labs received, on call contacted. 04/03/2020 6:38:06 PM Attempt made - no message left D'Heur Macky Lower 04/03/2020 6:39:56 PM Called On-Call Provider [place], RN, [place] 04/03/2020 6:44:00 PM Attempt made - line busy [place], RN, [place] 04/03/2020 6:45:00 PM Clinical Call [place], RN, [place] 04/03/2020 7:01:29 PM Attempt made - no message left [place], RN, Vincente Liberty 04/03/2020 7:33:59 PM FINAL ATTEMPT MADE - no message left Yes D'Heur Lucia Gaskins, RN, Vincente Liberty PLEASE NOTE: All timestamps contained within this report are represented as [place] Standard Time. CONFIDENTIALTY NOTICE: This fax transmission is intended only for the addressee. It contains information that is legally privileged, confidential or otherwise protected from use or disclosure. If you are not the intended recipient, you are strictly prohibited from reviewing, disclosing, copying using or disseminating any of this information or taking  any action in reliance on or regarding this information. If you have received this fax in error, please notify us immediately by telephone so that we can arrange for its return to [place]. Phone: [phone], Toll-Free: [phone], Fax: [phone] Page: 2 of 2 Call Id: [id number] [place] Disagree/Comply Comply Caller Understands Yes PreDisposition InappropriateToAsk Comments User: Vincente Liberty, D'Heur Lucia Gaskins, RN Date/Time Eilene Ghazi Time): 04/03/2020 6:30:43 PM voice mail not set up, unable to leave message. User: Vincente Liberty, D'Heur Lucia Gaskins, RN Date/Time Eilene Ghazi Time): 04/03/2020 6:43:50 PM Attempted to reach ordering PA, left message. Paging DoctorName Phone DateTime Result/Outcome Message Type Notes Cathlean Cower- MD [phone] 04/03/2020 4:43:15 PM Called On Call Provider - Reached Doctor Paged Cathlean Cower- MD 04/03/2020 6:29:41 PM Spoke with On Call - General Message Result As per on call, patient should be referred to ED. Cathlean Cower- MD [phone] 04/03/2020 6:39:56 PM Called On Call Provider - Reached Doctor Paged Cathlean Cower- MD 04/03/2020 6:41:28 PM Spoke with On Call - General Message Result Alerted on call that attempt to reach patient were unsuccessful & voice mailbox is full. MD agreed that I could try to reach ordering MD & keep trying to reach patient

## 2020-04-04 NOTE — Telephone Encounter (Signed)
I tried to call pt. Number she gave goes immediate to message stating voice mail not set up. Sent my chart message explaining to go to ED due to severe anemia. Will likely get transfusion. Can you try to call pt again. Document effort.

## 2020-04-04 NOTE — Telephone Encounter (Signed)
Spoke with pt and told her ED evaluation for transfusion was reccommended and she chuckled and said " ok " .

## 2020-05-08 ENCOUNTER — Encounter: Payer: Self-pay | Admitting: Obstetrics & Gynecology

## 2020-05-08 ENCOUNTER — Ambulatory Visit (INDEPENDENT_AMBULATORY_CARE_PROVIDER_SITE_OTHER): Payer: PRIVATE HEALTH INSURANCE

## 2020-05-08 ENCOUNTER — Encounter: Payer: Self-pay | Admitting: General Practice

## 2020-05-08 ENCOUNTER — Other Ambulatory Visit: Payer: Self-pay

## 2020-05-08 VITALS — BP 123/67 | HR 101 | Ht 62.0 in | Wt 164.0 lb

## 2020-05-08 DIAGNOSIS — N939 Abnormal uterine and vaginal bleeding, unspecified: Secondary | ICD-10-CM | POA: Diagnosis not present

## 2020-05-08 DIAGNOSIS — D500 Iron deficiency anemia secondary to blood loss (chronic): Secondary | ICD-10-CM

## 2020-05-08 MED ORDER — TRANEXAMIC ACID 650 MG PO TABS: 1300.0000 mg | ORAL_TABLET | Freq: Three times a day (TID) | ORAL | 2 refills | Status: DC

## 2020-05-08 NOTE — Progress Notes (Signed)
History:  30 y.o. G0P0000 here today for f/u of AUB. She was on Megace but, has daily bleeding that was worse than her prev sx. Pt did not start TXA as she did not get a Rx. She has tried OCPs and Nexplanon. She has not tried an LnIUD and is a bit scared of this. She reports that she feels better than she normally would. She denies dizziness or SOB at present.       The following portions of the patient's history were reviewed and updated as appropriate: allergies, current medications, past family history, past medical history, past social history, past surgical history and problem list.  Review of Systems:  Pertinent items are noted in HPI.    Objective:  Physical Exam Blood pressure 123/67, pulse (!) 101, height 5\' 2"  (1.575 m), weight 164 lb (74.4 kg), last menstrual period 04/23/2020.  CONSTITUTIONAL: Well-developed, well-nourished female in no acute distress.  HENT:  Normocephalic, atraumatic EYES: Conjunctivae and EOM are normal. No scleral icterus.  NECK: Normal range of motion SKIN: Skin is warm and dry. No rash noted. Not diaphoretic.No pallor. [place]: Alert and oriented to person, place, and time. Normal coordination.    Labs and Imaging CBC Latest Ref Rng & Units 04/03/2020 01/29/2020 11/16/2018  WBC 3.8 - 10.8 Thousand/uL 4.7 3.5 5.2  Hemoglobin 11.7 - 15.5 g/dL 5.4(LL) 7.6(L) 7.7(L)  Hematocrit 35.0 - 45.0 % 20.5(L) 26.8(L) 27.0(L)  Platelets 140 - 400 Thousand/uL 557(H) 316 482(H)    Assessment & Plan:  AUB leading to chronic blood loss anemia-   I have reviewed her management options. Pt would like to consider he LnIUD after reading more about it.   Lysteda 1300mg  tid x 5 days on the heaviest days of her cycle.   If that does not work pt will have the Duran placed.   CBC with diff today  F/u in 3 months or sooner prn  Total face-to-face time with patient was 25 min.  Greater than 50% was spent in counseling and coordination of care with the patient.   Lakira Ogando L.  Harraway-Smith, M.D., Cherlynn June

## 2020-05-08 NOTE — Patient Instructions (Signed)
Tranexamic acid oral tablets What is this medicine? TRANEXAMIC ACID (TRAN ex AM ik AS id) slows down or stops blood clots from being broken down. This medicine is used to treat heavy monthly menstrual bleeding. This medicine may be used for other purposes; ask your health care provider or pharmacist if you have questions. COMMON BRAND NAME(S): Cyklokapron, Lysteda What should I tell my health care provider before I take this medicine? They need to know if you have any of these conditions:  bleeding in the brain  blood clotting problems  kidney disease  vision problems  an unusual allergic reaction to tranexamic acid, other medicines, foods, dyes, or preservatives  pregnant or trying to get pregnant  breast-feeding How should I use this medicine? Take this medicine by mouth with a glass of water. Follow the directions on the prescription label. Do not cut, crush, or chew this medicine. You can take it with or without food. If it upsets your stomach, take it with food. Take your medicine at regular intervals. Do not take it more often than directed. Do not stop taking except on your doctor's advice. Do not take this medicine until your period has started. Do not take it for more than 5 days in a row. Do not take this medicine when you do not have your period. Talk to your pediatrician regarding the use of this medicine in children. While this drug may be prescribed for female children as young as 12 years of age for selected conditions, precautions do apply. Overdosage: If you think you have taken too much of this medicine contact a poison control center or emergency room at once. NOTE: This medicine is only for you. Do not share this medicine with others. What if I miss a dose? If you miss a dose, take it when you remember, and then take your next dose at least 6 hours later. Do not take more than 2 tablets at a time to make up for missed doses. What may interact with this medicine? Do  not take this medicine with any of the following medications:  estrogens  birth control pills, patches, injections, rings or other devices that contain both an estrogen and a progestin This medicine may also interact with the following medications:  certain medicines used to help your blood clot  tretinoin (taken by mouth) This list may not describe all possible interactions. Give your health care provider a list of all the medicines, herbs, non-prescription drugs, or dietary supplements you use. Also tell them if you smoke, drink alcohol, or use illegal drugs. Some items may interact with your medicine. What should I watch for while using this medicine? Tell your doctor or healthcare professional if your symptoms do not start to get better or if they get worse. Tell your doctor or healthcare professional if you notice any eye problems while taking this medicine. Your doctor will refer you to an eye doctor who will examine your eyes. What side effects may I notice from receiving this medicine? Side effects that you should report to your doctor or health care professional as soon as possible:  allergic reactions like skin rash, itching or hives, swelling of the face, lips, or tongue  breathing difficulties  changes in vision  sudden or severe pain in the chest, legs, head, or groin  unusually weak or tired Side effects that usually do not require medical attention (report to your doctor or health care professional if they continue or are bothersome):  back pain    headache  muscle or joint aches  sinus and nasal problems  stomach pain  tiredness This list may not describe all possible side effects. Call your doctor for medical advice about side effects. You may report side effects to FDA at 1-800-FDA-1088. Where should I keep my medicine? Keep out of the reach of children. Store at room temperature between 15 and 30 degrees C (59 and 86 degrees F). Throw away any unused  medicine after the expiration date. NOTE: This sheet is a summary. It may not cover all possible information. If you have questions about this medicine, talk to your doctor, pharmacist, or health care provider.  2021 Elsevier/Gold Standard (2015-05-16 09:12:15)  

## 2020-05-09 LAB — CBC WITH DIFFERENTIAL/PLATELET
Basophils Absolute: 0 10*3/uL (ref 0.0–0.2)
Basos: 1 %
EOS (ABSOLUTE): 0 10*3/uL (ref 0.0–0.4)
Eos: 1 %
Hematocrit: 23.3 % — ABNORMAL LOW (ref 34.0–46.6)
Hemoglobin: 6.1 g/dL — CL (ref 11.1–15.9)
Immature Grans (Abs): 0 10*3/uL (ref 0.0–0.1)
Immature Granulocytes: 0 %
Lymphocytes Absolute: 1.8 10*3/uL (ref 0.7–3.1)
Lymphs: 34 %
MCH: 15.9 pg — ABNORMAL LOW (ref 26.6–33.0)
MCHC: 26.2 g/dL — ABNORMAL LOW (ref 31.5–35.7)
MCV: 61 fL — ABNORMAL LOW (ref 79–97)
Monocytes Absolute: 0.4 10*3/uL (ref 0.1–0.9)
Monocytes: 8 %
Neutrophils Absolute: 3.1 10*3/uL (ref 1.4–7.0)
Neutrophils: 56 %
Platelets: 318 10*3/uL (ref 150–450)
RBC: 3.84 x10E6/uL (ref 3.77–5.28)
RDW: 21.1 % — ABNORMAL HIGH (ref 11.7–15.4)
WBC: 5.4 10*3/uL (ref 3.4–10.8)

## 2020-08-27 ENCOUNTER — Ambulatory Visit: Payer: PRIVATE HEALTH INSURANCE | Admitting: Medical

## 2020-09-06 ENCOUNTER — Ambulatory Visit (INDEPENDENT_AMBULATORY_CARE_PROVIDER_SITE_OTHER): Payer: No Typology Code available for payment source | Admitting: Medical

## 2020-09-06 ENCOUNTER — Other Ambulatory Visit: Payer: Self-pay

## 2020-09-06 VITALS — BP 126/82 | HR 100 | Ht 62.0 in | Wt 158.0 lb

## 2020-09-06 DIAGNOSIS — R5383 Other fatigue: Secondary | ICD-10-CM

## 2020-09-06 DIAGNOSIS — D500 Iron deficiency anemia secondary to blood loss (chronic): Secondary | ICD-10-CM

## 2020-09-06 DIAGNOSIS — S0083XS Contusion of other part of head, sequela: Secondary | ICD-10-CM

## 2020-09-06 DIAGNOSIS — T148XXA Other injury of unspecified body region, initial encounter: Secondary | ICD-10-CM | POA: Diagnosis not present

## 2020-09-06 DIAGNOSIS — R55 Syncope and collapse: Secondary | ICD-10-CM

## 2020-09-06 LAB — COMPREHENSIVE METABOLIC PANEL
AG Ratio: 1.2 (calc) (ref 1.0–2.5)
ALT: 14 U/L (ref 6–29)
AST: 17 U/L (ref 10–30)
Albumin: 4.5 g/dL (ref 3.6–5.1)
Alkaline phosphatase (APISO): 58 U/L (ref 31–125)
BUN: 8 mg/dL (ref 7–25)
CO2: 25 mmol/L (ref 20–32)
Calcium: 9.8 mg/dL (ref 8.6–10.2)
Chloride: 103 mmol/L (ref 98–110)
Creat: 0.7 mg/dL (ref 0.50–1.10)
Globulin: 3.7 g/dL (calc) (ref 1.9–3.7)
Glucose, Bld: 86 mg/dL (ref 65–99)
Potassium: 4.4 mmol/L (ref 3.5–5.3)
Sodium: 137 mmol/L (ref 135–146)
Total Bilirubin: 1.2 mg/dL (ref 0.2–1.2)
Total Protein: 8.2 g/dL — ABNORMAL HIGH (ref 6.1–8.1)

## 2020-09-06 LAB — CBC WITH DIFFERENTIAL/PLATELET
Absolute Monocytes: 432 cells/uL (ref 200–950)
Basophils Absolute: 42 cells/uL (ref 0–200)
Basophils Relative: 0.7 %
Eosinophils Absolute: 30 cells/uL (ref 15–500)
Eosinophils Relative: 0.5 %
HCT: 33.7 % — ABNORMAL LOW (ref 35.0–45.0)
Hemoglobin: 10 g/dL — ABNORMAL LOW (ref 11.7–15.5)
Lymphs Abs: 2316 cells/uL (ref 850–3900)
MCH: 21.9 pg — ABNORMAL LOW (ref 27.0–33.0)
MCHC: 29.7 g/dL — ABNORMAL LOW (ref 32.0–36.0)
MCV: 73.9 fL — ABNORMAL LOW (ref 80.0–100.0)
Monocytes Relative: 7.2 %
Neutro Abs: 3180 cells/uL (ref 1500–7800)
Neutrophils Relative %: 53 %
Platelets: 513 10*3/uL — ABNORMAL HIGH (ref 140–400)
RBC: 4.56 10*6/uL (ref 3.80–5.10)
RDW: 27.9 % — ABNORMAL HIGH (ref 11.0–15.0)
Total Lymphocyte: 38.6 %
WBC: 6 10*3/uL (ref 3.8–10.8)

## 2020-09-06 MED ORDER — AZITHROMYCIN 250 MG PO TABS: ORAL_TABLET | ORAL | 0 refills | Status: AC

## 2020-09-06 MED ORDER — NEOMYCIN-POLYMYXIN-HC 3.5-10000-1 OT SOLN: 3.0000 [drp] | Freq: Four times a day (QID) | OTIC | 0 refills | Status: DC

## 2020-09-06 NOTE — Patient Instructions (Addendum)
Syncope from severe anemia with history of heavy menses. Ttranexamic acid written but not filled. Follow up with gynecologist office. Will repeat cbc, iron level and cmp today stat. If  low level  Hb/hct again then would try to get  in with hematologist for possible transfusion. If severe low/emergent hb/hct value then advise ED evaluation.    With facial contusion after fall. Expect mid chin swellling to decrease in about 2-3 additonal weeks.  Area under nose hyperpigmentation/scar from abrasion. Use mederma and vit E.   For ear pain, rx cortipsrin otic drops and azithromycin antibiotic.  Small inflamed follicle under lip. Will treat for inflamed/infected follicle with antibiotic.  Follow up in 10-14 days or as needed

## 2020-09-06 NOTE — Progress Notes (Signed)
Subjective:    Patient ID: Rose Berry, female    DOB: 03/17/1991, 30 y.o.   MRN: [id number]  HPI  Pt had recent ED visit one month ago. She had syncope. Hospital visit notes show.  "Discharge Service: [facility]  Discharge Attending Physician: Houston Siren, MD  Discharge to: home  Condition at Discharge: good  Code Status: Full Code  Discharge Diagnoses   Active Hospital Problems  Diagnosis Date Noted POA  . *Symptomatic anemia 08/10/2020 Yes  . Abnormal uterine bleeding 08/10/2020 Yes  . Facial contusion, initial encounter 08/10/2020 Yes   Resolved Hospital Problems  Diagnosis Date Noted Date Resolved POA  . Acute blood loss anemia 08/10/2020 08/11/2020 Yes  . Syncope and collapse 08/10/2020 08/11/2020 Yes      Resolved Hospital Problems  Diagnosis Date Noted Date Resolved POA  . Acute blood loss anemia 08/10/2020 08/11/2020 Yes  . Syncope and collapse 08/10/2020 08/11/2020 Yes    Discharge Medications   Current Discharge Medication List   NEW medications  Details  traMADol (ULTRAM) 50 mg tablet Take one tablet (50 mg dose) by mouth every 6 (six) hours as needed for Pain for up to 5 days. Start date: 08/11/2020, End date: 08/16/2020   tranexamic acid (LYSTEDA) 650 mg TABS Take two tablets (1,300 mg dose) by mouth 3 (three) times a day for 5 days. Take PRN up to 5 days during monthly menstruation Start date: 08/11/2020, End date: 08/16/2020    CONTINUED medications  Details  Fe Fum-FePoly-FA-Vit C-Vit B3 (INTEGRA F) 125-1 MG CAPS Take one tab daily   Procedures   Bedside Procedures  No orders found   Hospital Course  Physicians involved in care during this hospitalization Attending Provider: Houston Siren, MD Admitting Provider: Houston Siren, MD  Hospital Course:   Patient was observed overnight due to syncope/facial contusion due to symptomatic anemia from uterine bleeding; had her last heavy period last week. Patient was seen by  GYN yesterday and patient just wants to treat her periods with tranexamic acid. Patient was transfused with 4 units of blood.  Other issues addressed during this hospitalization:  Facial contusion- treated with tylenol, prn tramadol. Diet changed to puree for now for some difficulty with solid foods.   Pt ct head and facial bones were negative. Reviewed in care everywhere.       Post hospitalization pt  states feels much better. Pt states sees gyn in about 2 weeks. Pt states has not been taking the tranexamic acid since discharge.   Pt had swollen chin and cheeks after fall. Now small swollen area mid chin. Also has some slight hyperpigmentation of area below her nose.  Pt states mild left ear pain inside canal.    Review of Systems  Constitutional: Positive for fatigue. Negative for chills and fever.       Slight fatigue now. She states almost back to normal. New job maybe making her tired but only slight.  Respiratory: Negative for chest tightness, shortness of breath and wheezing.   Cardiovascular: Negative for chest pain and palpitations.  Gastrointestinal: Negative for abdominal pain.  Genitourinary: Negative for dysuria, flank pain and frequency.  Musculoskeletal: Negative for back pain, joint swelling, myalgias and neck stiffness.  Skin: Negative for rash.  Neurological: Negative for dizziness, syncope, weakness, numbness and headaches.  Hematological: Negative for adenopathy. Does not bruise/bleed easily.  Psychiatric/Behavioral: Negative for behavioral problems and decreased concentration.    Past Medical History:  Diagnosis Date  . Anemia   .  Asthma   . UTI (urinary tract infection)      Social History   Socioeconomic History  . Marital status: Single    Spouse name: Not on file  . Number of children: Not on file  . Years of education: Not on file  . Highest education level: Not on file  Occupational History  . Not on file  Tobacco Use  . Smoking status:  Never Smoker  . Smokeless tobacco: Never Used  Vaping Use  . Vaping Use: Never used  Substance and Sexual Activity  . Alcohol use: No  . Drug use: No  . Sexual activity: Not Currently    Birth control/protection: Condom  Other Topics Concern  . Not on file  Social History Narrative  . Not on file   Social Determinants of Health   Financial Resource Strain: Not on file  Food Insecurity: Not on file  Transportation Needs: Not on file  Physical Activity: Not on file  Stress: Not on file  Social Connections: Not on file  Intimate Partner Violence: Not on file    No past surgical history on file.  Family History  Problem Relation Age of Onset  . Arthritis Mother   . Hypertension Mother   . Hyperlipidemia Maternal Grandmother   . Hypertension Maternal Grandmother   . Diabetes Maternal Grandmother   . Kidney disease Maternal Grandmother   . Hyperlipidemia Maternal Grandfather   . Hypertension Maternal Grandfather   . Diabetes Maternal Grandfather   . Kidney disease Maternal Grandfather   . Hyperlipidemia Paternal Grandmother   . Hypertension Paternal Grandmother   . Diabetes Paternal Grandmother   . Kidney disease Paternal Grandmother   . Hyperlipidemia Paternal Grandfather   . Hypertension Paternal Grandfather   . Diabetes Paternal Grandfather   . Kidney disease Paternal Grandfather     No Known Allergies  Current Outpatient Medications on File Prior to Visit  Medication Sig Dispense Refill  . Fe Fum-FePoly-FA-Vit C-Vit B3 (INTEGRA F) 125-1 MG CAPS Take 1 capsule by mouth daily. 30 capsule 2  . Multiple Vitamins-Minerals (MULTIVITAMIN GUMMIES WOMENS) CHEW Chew 2 each by mouth daily.    Marland Kitchen albuterol (PROVENTIL HFA;VENTOLIN HFA) 108 (90 Base) MCG/ACT inhaler Inhale 2 puffs into the lungs every 6 (six) hours as needed for wheezing or shortness of breath. 1 Inhaler 0  . ferrous sulfate 325 (65 FE) MG tablet Take 1 tablet (325 mg total) by mouth 2 (two) times daily with a  meal. (Patient not taking: Reported on 01/29/2020) 60 tablet 3  . megestrol (MEGACE) 40 MG tablet Take 1 tablet (40 mg total) by mouth 2 (two) times daily. Can increase to two tablets twice a day in the event of heavy bleeding (Patient not taking: Reported on 05/08/2020) 90 tablet 2  . metoCLOPramide (REGLAN) 10 MG tablet Take 1 tablet (10 mg total) by mouth daily as needed for nausea. (Patient not taking: Reported on 05/08/2020) 5 tablet 0  . tranexamic acid (LYSTEDA) 650 MG TABS tablet Take 2 tablets (1,300 mg total) by mouth 3 (three) times daily. Take during menses for a maximum of five days. On heaviest days of cycle 30 tablet 2   No current facility-administered medications on file prior to visit.    BP 126/82   Pulse 100   Ht 5\' 2"  (1.575 m)   Wt 158 lb (71.7 kg)   LMP 08/26/2020   SpO2 95%   BMI 28.90 kg/m       Objective:   Physical  Exam  General Mental Status- Alert. General Appearance- Not in acute distress.   Skin General: Color- Normal Color. Moisture- Normal Moisture.small follicle below low lip looks inflamed.  Neck Carotid Arteries- Normal color. Moisture- Normal Moisture. No carotid bruits. No JVD.  Chest and Lung Exam Auscultation: Breath Sounds:-Normal.  Cardiovascular Auscultation:Rythm- Regular. Murmurs & Other Heart Sounds:Auscultation of the heart reveals- No Murmurs.  Abdomen Inspection:-Inspeection Normal. Palpation/Percussion:Note:No mass. Palpation and Percussion of the abdomen reveal- Non Tender, Non Distended + BS, no rebound or guarding.    Neurologic Cranial Nerve exam:- CN III-XII intact(No nystagmus), symmetric smile. Strength:- 5/5 equal and symmetric strength both upper and lower extremities.  heent- no facial bone pain. Beneath lip small hyperpigmented region over abrasion. Mid chin small swollen area region of prior trauma from fall. Left ear canal clear. Normal tm.      Assessment & Plan:

## 2020-10-08 ENCOUNTER — Encounter: Payer: Self-pay | Admitting: Medical

## 2020-10-22 ENCOUNTER — Other Ambulatory Visit: Payer: Self-pay

## 2020-10-22 ENCOUNTER — Ambulatory Visit (INDEPENDENT_AMBULATORY_CARE_PROVIDER_SITE_OTHER): Payer: 59 | Admitting: Family Medicine

## 2020-10-22 ENCOUNTER — Encounter: Payer: Self-pay | Admitting: Family Medicine

## 2020-10-22 VITALS — BP 120/80 | HR 106 | Temp 99.6°F | Resp 18 | Ht 62.0 in | Wt 160.6 lb

## 2020-10-22 DIAGNOSIS — H9202 Otalgia, left ear: Secondary | ICD-10-CM | POA: Diagnosis not present

## 2020-10-22 MED ORDER — CEFDINIR 300 MG PO CAPS: 300.0000 mg | ORAL_CAPSULE | Freq: Two times a day (BID) | ORAL | 0 refills | Status: DC

## 2020-10-22 MED ORDER — PREDNISONE 10 MG PO TABS: ORAL_TABLET | ORAL | 0 refills | Status: DC

## 2020-10-22 NOTE — Progress Notes (Addendum)
Subjective:   By signing my name below, I, Rose Berry, attest that this documentation has been prepared under the direction and in the presence of Dr. Roma Schanz, DO. 10/22/2020     Patient ID: Rose Berry, female    DOB: 03/20/1991, 30 y.o.   MRN: [id number]  Chief Complaint  Patient presents with   Facial Pain    Left side, x1 month, Pt states no sinus pain but having teeth pain and ear pain    HPI Patient is in today for a office visit. She complains of constant left side jaw and ear pain since last April. Her jaw pain worsens while opening her mouth wide. Last April she fainted and fell on her left side face and required a dental procedure to repair her two front teeth as a results of the fall. She had a follow up with her dentist to address the pain but they could not find anything. She has tried OTC medication to manage her pain but finds no relief. She works from home and uses a headset while working and finds that the headset irritates her ear and jaw.  She fainted last April due to having a hemoglobin low hemoglobin count. She was given 4 bags of blood during transfusion. She does not see a hematologist at this time and only takes iron supplements to prevent anymore symptoms and reports doing well. She denies any pain while swallowing.   Past Medical History:  Diagnosis Date   Anemia    Asthma    UTI (urinary tract infection)     No past surgical history on file.  Family History  Problem Relation Age of Onset   Arthritis Mother    Hypertension Mother    Hyperlipidemia Maternal Grandmother    Hypertension Maternal Grandmother    Diabetes Maternal Grandmother    Kidney disease Maternal Grandmother    Hyperlipidemia Maternal Grandfather    Hypertension Maternal Grandfather    Diabetes Maternal Grandfather    Kidney disease Maternal Grandfather    Hyperlipidemia Paternal Grandmother    Hypertension Paternal Grandmother    Diabetes Paternal  Grandmother    Kidney disease Paternal Grandmother    Hyperlipidemia Paternal Grandfather    Hypertension Paternal Grandfather    Diabetes Paternal Grandfather    Kidney disease Paternal Grandfather     Social History   Socioeconomic History   Marital status: Single    Spouse name: Not on file   Number of children: Not on file   Years of education: Not on file   Highest education level: Not on file  Occupational History   Not on file  Tobacco Use   Smoking status: Never   Smokeless tobacco: Never  Vaping Use   Vaping Use: Never used  Substance and Sexual Activity   Alcohol use: No   Drug use: No   Sexual activity: Not Currently    Birth control/protection: Condom  Other Topics Concern   Not on file  Social History Narrative   Not on file   Social Determinants of Health   Financial Resource Strain: Not on file  Food Insecurity: Not on file  Transportation Needs: Not on file  Physical Activity: Not on file  Stress: Not on file  Social Connections: Not on file  Intimate Partner Violence: Not on file    Outpatient Medications Prior to Visit  Medication Sig Dispense Refill   Fe Fum-FePoly-FA-Vit C-Vit B3 (INTEGRA F) 125-1 MG CAPS Take 1 capsule by mouth daily. 30 capsule  2   Multiple Vitamins-Minerals (MULTIVITAMIN GUMMIES WOMENS) CHEW Chew 2 each by mouth daily.     neomycin-polymyxin-hydrocortisone (CORTISPORIN) OTIC solution Place 3 drops into the left ear 4 (four) times daily. 10 mL 0   albuterol (PROVENTIL HFA;VENTOLIN HFA) 108 (90 Base) MCG/ACT inhaler Inhale 2 puffs into the lungs every 6 (six) hours as needed for wheezing or shortness of breath. 1 Inhaler 0   ferrous sulfate 325 (65 FE) MG tablet Take 1 tablet (325 mg total) by mouth 2 (two) times daily with a meal. (Patient not taking: Reported on 01/29/2020) 60 tablet 3   megestrol (MEGACE) 40 MG tablet Take 1 tablet (40 mg total) by mouth 2 (two) times daily. Can increase to two tablets twice a day in the event  of heavy bleeding (Patient not taking: Reported on 05/08/2020) 90 tablet 2   metoCLOPramide (REGLAN) 10 MG tablet Take 1 tablet (10 mg total) by mouth daily as needed for nausea. (Patient not taking: Reported on 05/08/2020) 5 tablet 0   tranexamic acid (LYSTEDA) 650 MG TABS tablet Take 2 tablets (1,300 mg total) by mouth 3 (three) times daily. Take during menses for a maximum of five days. On heaviest days of cycle 30 tablet 2   No facility-administered medications prior to visit.    No Known Allergies  Review of Systems  Constitutional:  Negative for chills, fever and malaise/fatigue.  HENT:  Negative for congestion and hearing loss.   Eyes:  Negative for blurred vision and discharge.  Respiratory:  Negative for cough, sputum production and shortness of breath.   Cardiovascular:  Negative for chest pain, palpitations and leg swelling.  Gastrointestinal:  Negative for abdominal pain, blood in stool, constipation, diarrhea, heartburn, nausea and vomiting.  Genitourinary:  Negative for dysuria, frequency, hematuria and urgency.  Musculoskeletal:  Positive for myalgias (Left ear and jaw). Negative for back pain and falls.  Skin:  Negative for rash.  Neurological:  Negative for dizziness, sensory change, loss of consciousness, weakness and headaches.  Endo/Heme/Allergies:  Negative for environmental allergies. Does not bruise/bleed easily.  Psychiatric/Behavioral:  Negative for depression and suicidal ideas. The patient is not nervous/anxious and does not have insomnia.   All other systems reviewed and are negative.     Objective:    Physical Exam Vitals and nursing note reviewed.  Constitutional:      General: She is not in acute distress.    Appearance: Normal appearance. She is not ill-appearing.  HENT:     Head: Normocephalic and atraumatic.     Comments: Small click heard on jaw once during exam.    Right Ear: Tympanic membrane, ear canal and external ear normal.     Left Ear:  External ear normal. Tenderness present. A middle ear effusion is present. There is no impacted cerumen. No mastoid tenderness. Tympanic membrane is not injected, perforated or erythematous. Tympanic membrane has decreased mobility.     Ears:     Comments: Left ear dullness with some fluid present. Eyes:     Extraocular Movements: Extraocular movements intact.     Pupils: Pupils are equal, round, and reactive to light.  Cardiovascular:     Rate and Rhythm: Normal rate and regular rhythm.     Pulses: Normal pulses.     Heart sounds: Normal heart sounds. No murmur heard.   No gallop.  Pulmonary:     Effort: Pulmonary effort is normal. No respiratory distress.     Breath sounds: Normal breath sounds. No wheezing, rhonchi  or rales.  Skin:    General: Skin is warm and dry.  Neurological:     Mental Status: She is alert and oriented to person, place, and time.  Psychiatric:        Behavior: Behavior normal.    BP 120/80 (BP Location: Right Arm, Patient Position: Sitting, Cuff Size: Normal)   Pulse (!) 106   Temp 99.6 F (37.6 C) (Oral)   Resp 18   Ht 5\' 2"  (1.575 m)   Wt 160 lb 9.6 oz (72.8 kg)   SpO2 99%   BMI 29.37 kg/m  Wt Readings from Last 3 Encounters:  10/22/20 160 lb 9.6 oz (72.8 kg)  09/06/20 158 lb (71.7 kg)  05/08/20 164 lb (74.4 kg)    Diabetic Foot Exam - Simple   No data filed    Lab Results  Component Value Date   WBC 6.0 09/06/2020   HGB 10.0 (L) 09/06/2020   HCT 33.7 (L) 09/06/2020   PLT 513 (H) 09/06/2020   GLUCOSE 86 09/06/2020   ALT 14 09/06/2020   AST 17 09/06/2020   NA 137 09/06/2020   K 4.4 09/06/2020   CL 103 09/06/2020   CREATININE 0.70 09/06/2020   BUN 8 09/06/2020   CO2 25 09/06/2020   TSH 1.600 09/14/2018    Lab Results  Component Value Date   TSH 1.600 09/14/2018   Lab Results  Component Value Date   WBC 6.0 09/06/2020   HGB 10.0 (L) 09/06/2020   HCT 33.7 (L) 09/06/2020   MCV 73.9 (L) 09/06/2020   PLT 513 (H) 09/06/2020    Lab Results  Component Value Date   NA 137 09/06/2020   K 4.4 09/06/2020   CO2 25 09/06/2020   GLUCOSE 86 09/06/2020   BUN 8 09/06/2020   CREATININE 0.70 09/06/2020   BILITOT 1.2 09/06/2020   ALKPHOS 52 04/03/2020   AST 17 09/06/2020   ALT 14 09/06/2020   PROT 8.2 (H) 09/06/2020   ALBUMIN 4.2 04/03/2020   CALCIUM 9.8 09/06/2020   GFR 106.50 04/03/2020   No results found for: CHOL No results found for: HDL No results found for: LDLCALC No results found for: TRIG No results found for: CHOLHDL No results found for: HGBA1C     Assessment & Plan:   Problem List Items Addressed This Visit   None Visit Diagnoses     Acute otalgia, left    -  Primary   Relevant Medications   cefdinir (OMNICEF) 300 MG capsule   predniSONE (DELTASONE) 10 MG tablet   Other Relevant Orders   Ambulatory referral to ENT        Meds ordered this encounter  Medications   cefdinir (OMNICEF) 300 MG capsule    Sig: Take 1 capsule (300 mg total) by mouth 2 (two) times daily.    Dispense:  20 capsule    Refill:  0   predniSONE (DELTASONE) 10 MG tablet    Sig: TAKE 3 TABLETS PO QD FOR 3 DAYS THEN TAKE 2 TABLETS PO QD FOR 3 DAYS THEN TAKE 1 TABLET PO QD FOR 3 DAYS THEN TAKE 1/2 TAB PO QD FOR 3 DAYS    Dispense:  20 tablet    Refill:  0    I, Dr. Roma Schanz, DO, personally preformed the services described in this documentation.  All medical record entries made by the scribe were at my direction and in my presence.  I have reviewed the chart and discharge instructions (if applicable) and  agree that the record reflects my personal performance and is accurate and complete. 10/22/2020   I,Rose Berry,acting as a scribe for Ann Held, DO.,have documented all relevant documentation on the behalf of Ann Held, DO,as directed by  Ann Held, DO while in the presence of Ann Held, DO.   Ann Held, DO

## 2020-10-22 NOTE — Patient Instructions (Signed)
Earache, Adult An earache, or ear pain, can be caused by many things, including: An infection. Ear wax buildup. Ear pressure. Something in the ear that should not be there (foreign body). A sore throat. Tooth problems. Jaw problems. Treatment of the earache will depend on the cause. If the cause is not clear or cannot be determined, you may need to watch your symptoms until your earache goes away or until a cause is found. Follow these instructions at home: Medicines Take or apply over-the-counter and prescription medicines only as told by your health care provider. If you were prescribed an antibiotic medicine, use it as told by your health care provider. Do not stop using the antibiotic even if you start to feel better. Do not put anything in your ear other than medicine that is prescribed by your health care provider. Managing pain If directed, apply heat to the affected area as often as told by your health care provider. Use the heat source that your health care provider recommends, such as a moist heat pack or a heating pad. Place a towel between your skin and the heat source. Leave the heat on for 20-30 minutes. Remove the heat if your skin turns bright red. This is especially important if you are unable to feel pain, heat, or cold. You may have a greater risk of getting burned. If directed, put ice on the affected area as often as told by your health care provider. To do this:   Put ice in a plastic bag. Place a towel between your skin and the bag. Leave the ice on for 20 minutes, 2-3 times a day. General instructions Pay attention to any changes in your symptoms. Try resting in an upright position instead of lying down. This may help to reduce pressure in your ear and relieve pain. Chew gum if it helps to relieve your ear pain. Treat any allergies as told by your health care provider. Drink enough fluid to keep your urine pale yellow. It is up to you to get the results of any  tests that were done. Ask your health care provider, or the department that is doing the tests, when your results will be ready. Keep all follow-up visits as told by your health care provider. This is important. Contact a health care provider if: Your pain does not improve within 2 days. Your earache gets worse. You have new symptoms. You have a fever. Get help right away if you: Have a severe headache. Have a stiff neck. Have trouble swallowing. Have redness or swelling behind your ear. Have fluid or blood coming from your ear. Have hearing loss. Feel dizzy. Summary An earache, or ear pain, can be caused by many things. Treatment of the earache will depend on the cause. Follow recommendations from your health care provider to treat your ear pain. If the cause is not clear or cannot be determined, you may need to watch your symptoms until your earache goes away or until a cause is found. Keep all follow-up visits as told by your health care provider. This is important. This information is not intended to replace advice given to you by your health care provider. Make sure you discuss any questions you have with your health care provider. Document Revised: 11/19/2018 Document Reviewed: 11/19/2018 Elsevier Patient Education  2022 Elsevier Inc.  

## 2021-02-22 IMAGING — US TRANSVAGINAL ULTRASOUND OF PELVIS
1 series · 13 of 25 positions shown · non-contrast
Comparison: 10/18/2018

CLINICAL DATA: Menorrhagia, pelvic pain and heavy bleeding for 2
months, history of fibroids



[Series 1: transvaginal ultrasound of pelvis · 13 of 58 slices shown]
[im 1/58]
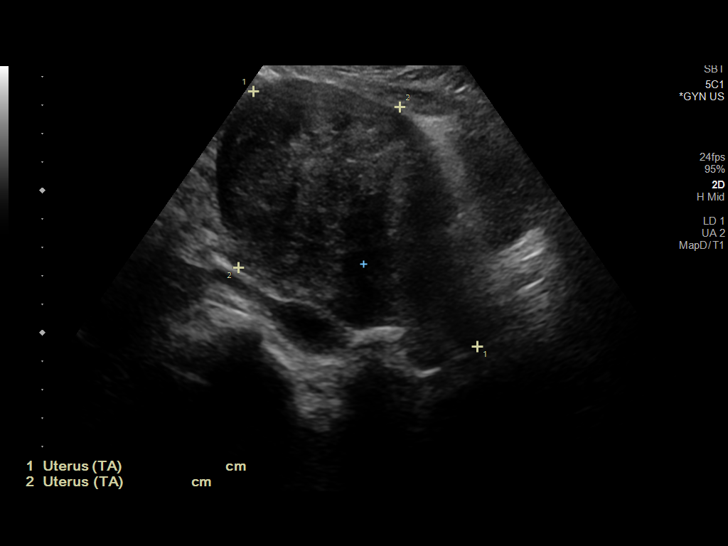
[im 5/58]
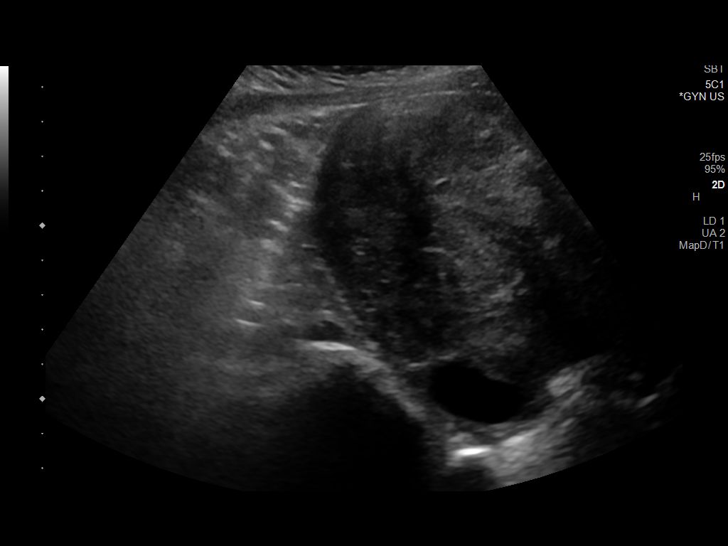
[im 10/58]
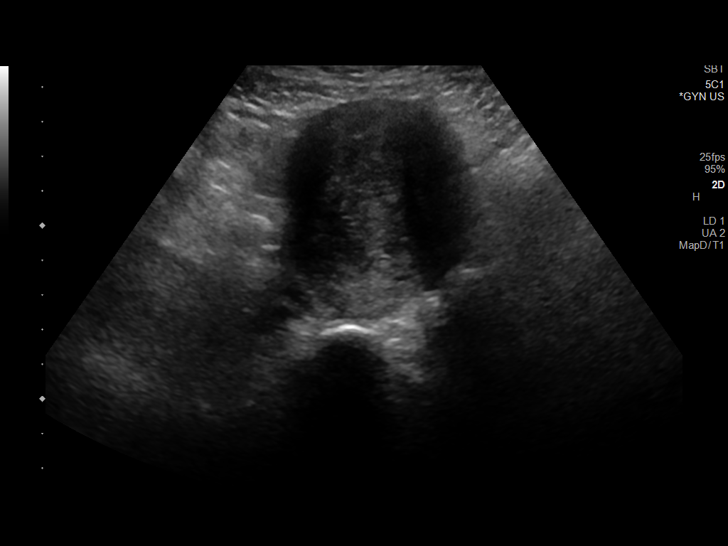
[im 15/58]
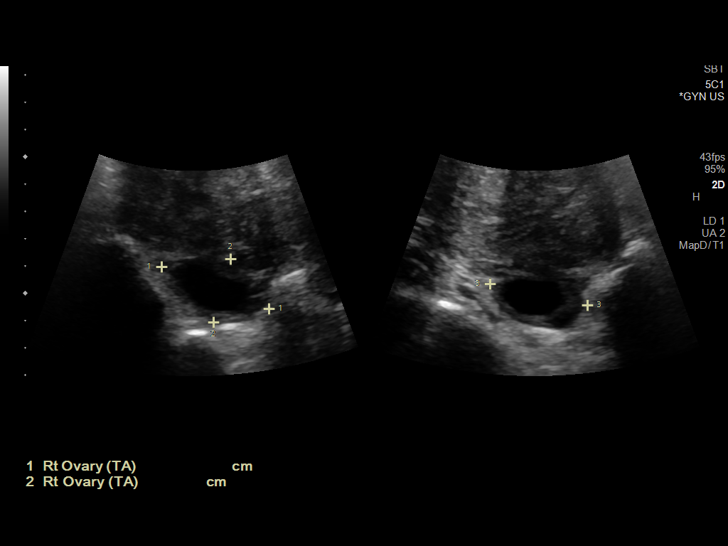
[im 20/58]
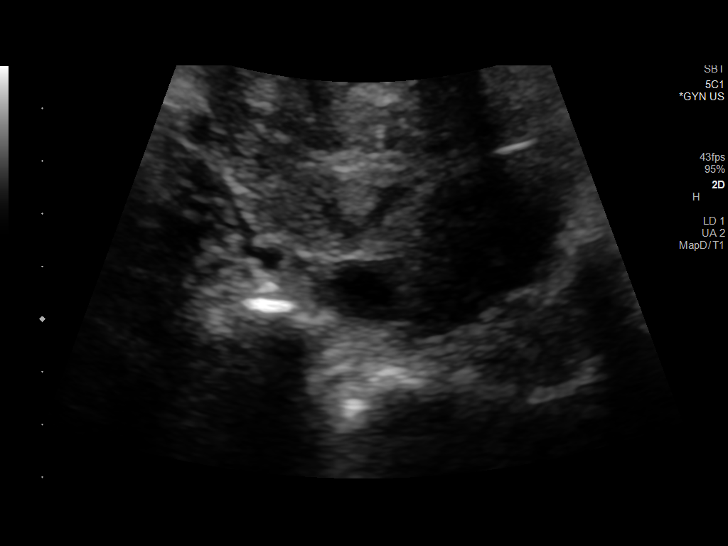
[im 24/58]
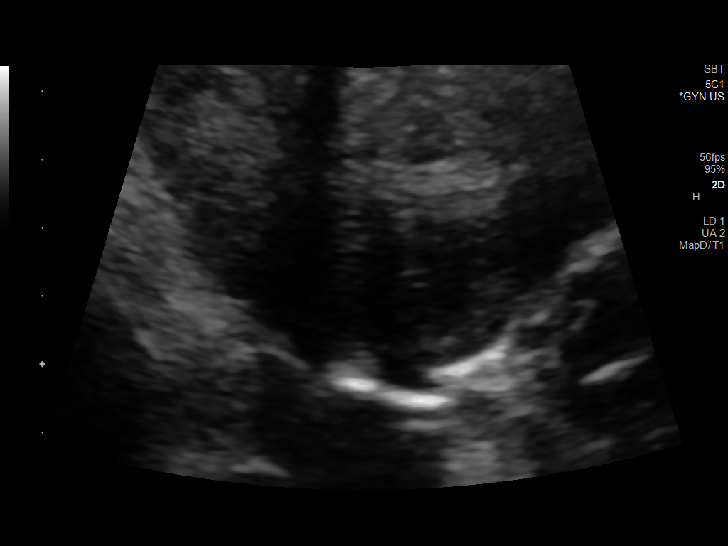
[im 29/58]
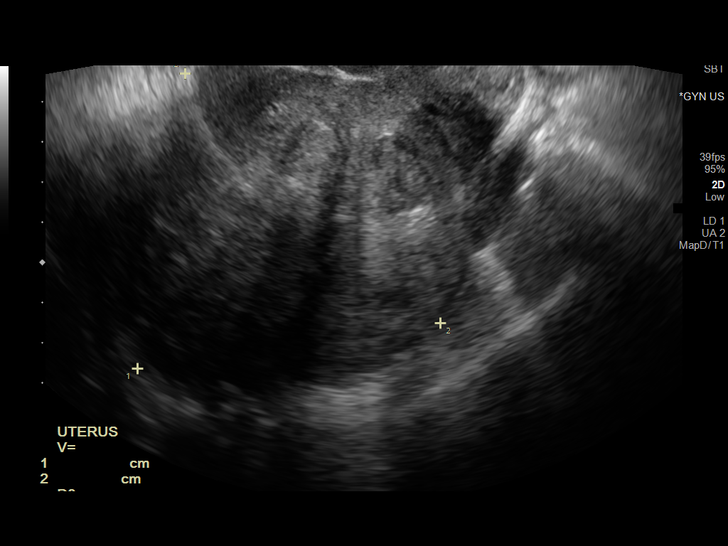
[im 34/58]
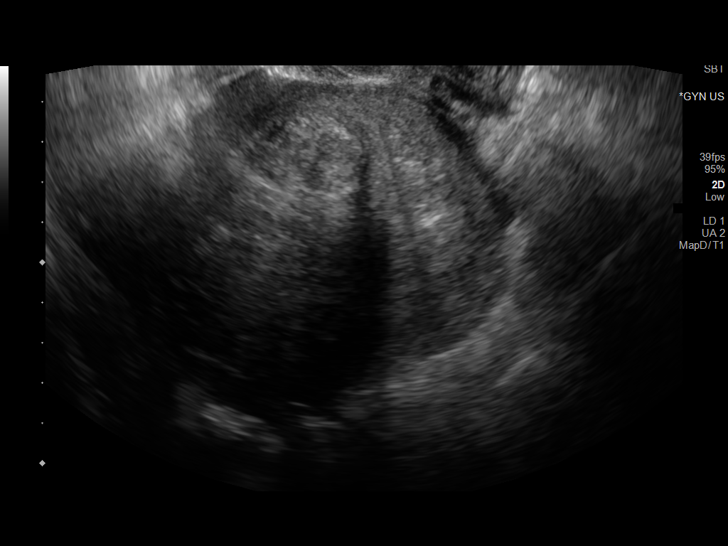
[im 39/58]
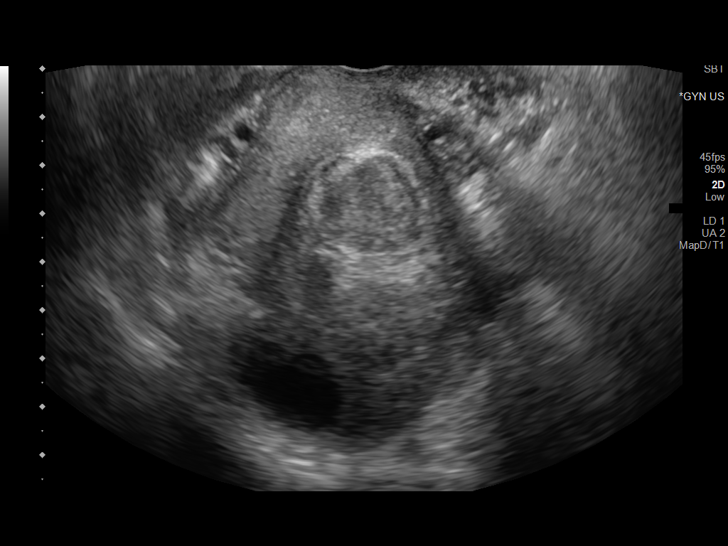
[im 43/58]
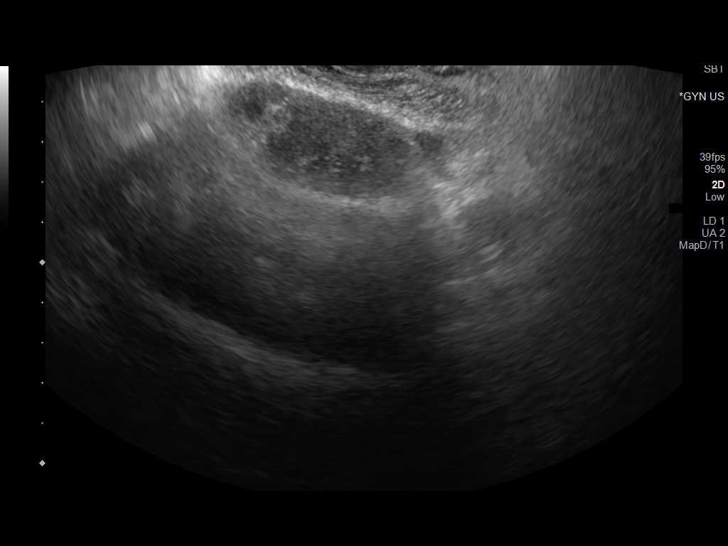
[im 48/58]
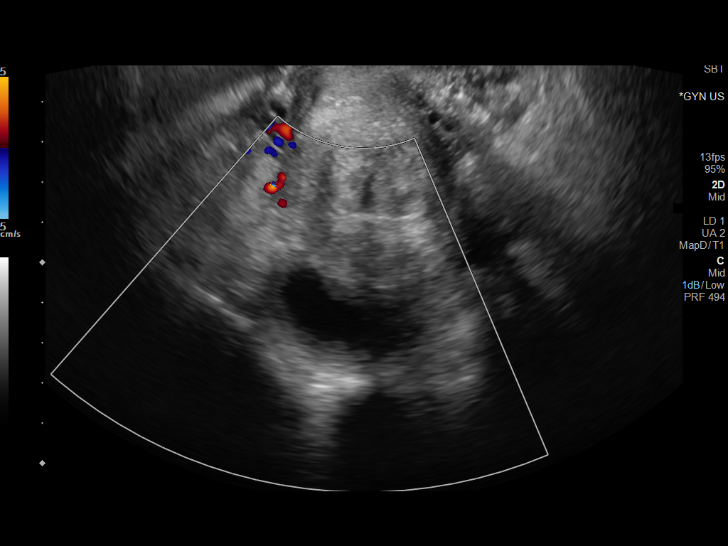
[im 53/58]
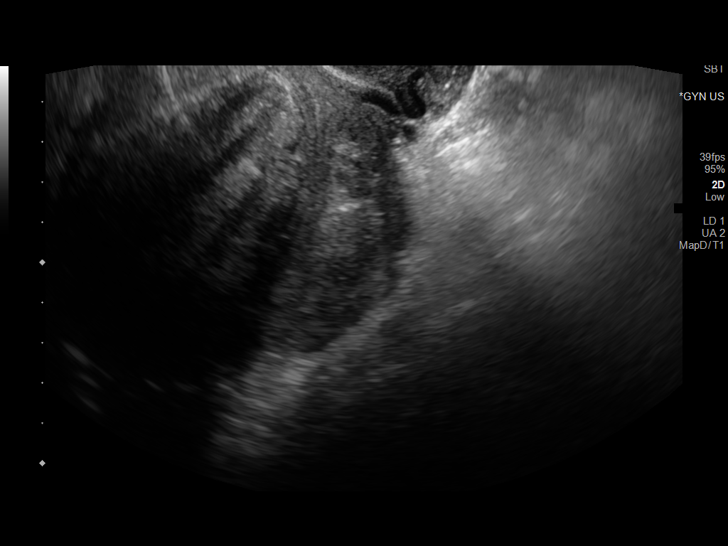
[im 58/58]
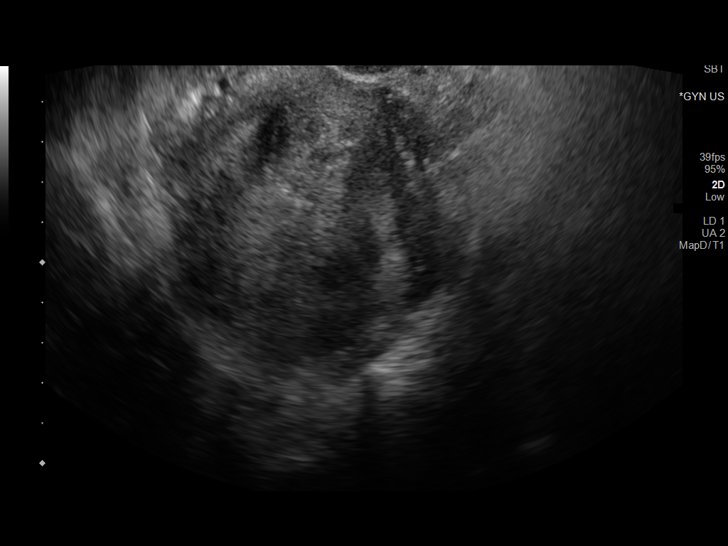

[13 of 25 positions shown; findings below may reference images not displayed]

FINDINGS: Uterus

Measurements: 12.5 x 8.9 x 7.8 cm = volume: 451 mL. Appears enlarged
and heterogeneous. Large anterior upper uterine mass 6.2 x 4.9 x
cm, intramural extending submucosal. Additional small leiomyoma at
posterior mid uterus, intramural, 2.3 x 2.2 x 2.8 cm.

Endometrium

Displaced by large leiomyoma at upper uterine segment. Measures up
to 8 mm thick. No focal abnormality or endometrial fluid seen

Right ovary

Measurements: 4.2 x 2.4 x 3.7 cm = volume: 19.4. Dominant follicle
versus corpus luteum. No additional masses

Left ovary

Measurements: 3.0 x 1.6 x 2.5 cm = volume: 6.1 mL. Normal morphology
without mass

Other findings

Trace free pelvic fluid.  No adnexal masses otherwise seen.
IMPRESSION: 2 large uterine leiomyomata, larger of which measures 6.2 cm in
greatest size and extends submucosal at the anterior aspect of the
upper uterine segment.

Smaller leiomyoma is at the posterior mid uterus, intramural, 2.8 cm
greatest diameter.

No adnexal abnormalities.

## 2021-12-10 ENCOUNTER — Ambulatory Visit: Payer: 59 | Admitting: Obstetrics & Gynecology

## 2022-01-02 ENCOUNTER — Telehealth: Payer: Self-pay | Admitting: General Practice

## 2022-01-02 NOTE — Telephone Encounter (Signed)
Left message on VM informing pt that her appointment with Dr. Ihor Dow on 01/14/2022 will need to be rescheduled d/t provider will be on medical leave.  Asked pt to contact our office to reschedule.

## 2022-01-12 ENCOUNTER — Ambulatory Visit (INDEPENDENT_AMBULATORY_CARE_PROVIDER_SITE_OTHER): Payer: 59 | Admitting: Medical

## 2022-01-12 ENCOUNTER — Telehealth: Payer: Self-pay

## 2022-01-12 ENCOUNTER — Encounter: Payer: Self-pay | Admitting: Medical

## 2022-01-12 VITALS — BP 129/79 | HR 68 | Temp 98.6°F | Ht 62.0 in | Wt 179.0 lb

## 2022-01-12 DIAGNOSIS — D649 Anemia, unspecified: Secondary | ICD-10-CM | POA: Diagnosis not present

## 2022-01-12 DIAGNOSIS — E611 Iron deficiency: Secondary | ICD-10-CM

## 2022-01-12 DIAGNOSIS — Z23 Encounter for immunization: Secondary | ICD-10-CM | POA: Diagnosis not present

## 2022-01-12 DIAGNOSIS — Z0000 Encounter for general adult medical examination without abnormal findings: Secondary | ICD-10-CM | POA: Diagnosis not present

## 2022-01-12 DIAGNOSIS — R519 Headache, unspecified: Secondary | ICD-10-CM

## 2022-01-12 DIAGNOSIS — Z113 Encounter for screening for infections with a predominantly sexual mode of transmission: Secondary | ICD-10-CM

## 2022-01-12 LAB — LIPID PANEL
Cholesterol: 135 mg/dL (ref 0–200)
HDL: 59.7 mg/dL (ref >39.00)
LDL Cholesterol: 67 mg/dL (ref 0–99)
NonHDL: 74.87
Total CHOL/HDL Ratio: 2
Triglycerides: 39 mg/dL (ref 0.0–149.0)
VLDL: 7.8 mg/dL (ref 0.0–40.0)

## 2022-01-12 LAB — COMPREHENSIVE METABOLIC PANEL
ALT: 18 U/L (ref 0–35)
AST: 20 U/L (ref 0–37)
Albumin: 4.3 g/dL (ref 3.5–5.2)
Alkaline Phosphatase: 55 U/L (ref 39–117)
BUN: 8 mg/dL (ref 6–23)
CO2: 24 mEq/L (ref 19–32)
Calcium: 9.5 mg/dL (ref 8.4–10.5)
Chloride: 104 mEq/L (ref 96–112)
Creatinine, Ser: 0.8 mg/dL (ref 0.40–1.20)
GFR: 97.34 mL/min (ref >60.00)
Glucose, Bld: 84 mg/dL (ref 70–99)
Potassium: 4.2 mEq/L (ref 3.5–5.1)
Sodium: 137 mEq/L (ref 135–145)
Total Bilirubin: 0.9 mg/dL (ref 0.2–1.2)
Total Protein: 7.7 g/dL (ref 6.0–8.3)

## 2022-01-12 LAB — IRON: Iron: 13 ug/dL — ABNORMAL LOW (ref 42–145)

## 2022-01-12 LAB — CBC WITH DIFFERENTIAL/PLATELET
Basophils Absolute: 0 10*3/uL (ref 0.0–0.1)
Basophils Relative: 1 % (ref 0.0–3.0)
Eosinophils Absolute: 0 10*3/uL (ref 0.0–0.7)
Eosinophils Relative: 0.6 % (ref 0.0–5.0)
HCT: 23.8 % — CL (ref 36.0–46.0)
Hemoglobin: 6.7 g/dL — CL (ref 12.0–15.0)
Lymphocytes Relative: 38.1 % (ref 12.0–46.0)
Lymphs Abs: 1.7 10*3/uL (ref 0.7–4.0)
MCHC: 28.1 g/dL — ABNORMAL LOW (ref 30.0–36.0)
MCV: 56.6 fl — ABNORMAL LOW (ref 78.0–100.0)
Monocytes Absolute: 0.2 10*3/uL (ref 0.1–1.0)
Monocytes Relative: 5.7 % (ref 3.0–12.0)
Neutro Abs: 2.4 10*3/uL (ref 1.4–7.7)
Neutrophils Relative %: 54.6 % (ref 43.0–77.0)
Platelets: 463 10*3/uL — ABNORMAL HIGH (ref 150.0–400.0)
RBC: 4.2 Mil/uL (ref 3.87–5.11)
RDW: 23.4 % — ABNORMAL HIGH (ref 11.5–15.5)
WBC: 4.4 10*3/uL (ref 4.0–10.5)

## 2022-01-12 LAB — SEDIMENTATION RATE: Sed Rate: 18 mm/hr (ref 0–20)

## 2022-01-12 MED ORDER — BUTALBITAL-APAP-CAFF-COD 50-325-40-30 MG PO CAPS: 1.0000 | ORAL_CAPSULE | Freq: Four times a day (QID) | ORAL | 0 refills | Status: DC | PRN

## 2022-01-12 MED ORDER — KETOROLAC TROMETHAMINE 60 MG/2ML IM SOLN
60.0000 mg | Freq: Once | INTRAMUSCULAR | Status: AC
  Administered 2022-01-12: 60 mg via INTRAMUSCULAR

## 2022-01-12 NOTE — Addendum Note (Signed)
Addended by: Anabel Halon on: 01/12/2022 01:30 PM   Modules accepted: Level of Service

## 2022-01-12 NOTE — Patient Instructions (Addendum)
For you wellness exam today I have ordered cbc, cmp and lipid panel. Labs include hiv screen  Vaccine given today tdap.  Recommend exercise and healthy diet.  We will let you know lab results as they come in.  Follow up date appointment will be determined after lab review.    For headache we gave toradol 60 mg IM. Left me know by tomorrow if ha has subsided. If ha does not improve, worsens or changes then be seen in ED.  Rare sporadic ha every 2-3 month. Limited rx fiorcet. Need update on how this works for future ha.   Updates important as might get imaging or refer to neurologist.  For anemia added iron level. Will follow gyn notes when they send regarding fibroids.   Preventive Care 36-62 Years Old, Female Preventive care refers to lifestyle choices and visits with your health care provider that can promote health and wellness. Preventive care visits are also called wellness exams. What can I expect for my preventive care visit? Counseling During your preventive care visit, your health care provider may ask about your: Medical history, including: Past medical problems. Family medical history. Pregnancy history. Current health, including: Menstrual cycle. Method of birth control. Emotional well-being. Home life and relationship well-being. Sexual activity and sexual health. Lifestyle, including: Alcohol, nicotine or tobacco, and drug use. Access to firearms. Diet, exercise, and sleep habits. Work and work Statistician. Sunscreen use. Safety issues such as seatbelt and bike helmet use. Physical exam Your health care provider may check your: Height and weight. These may be used to calculate your BMI (body mass index). BMI is a measurement that tells if you are at a healthy weight. Waist circumference. This measures the distance around your waistline. This measurement also tells if you are at a healthy weight and may help predict your risk of certain diseases, such as type 2  diabetes and high blood pressure. Heart rate and blood pressure. Body temperature. Skin for abnormal spots. What immunizations do I need?  Vaccines are usually given at various ages, according to a schedule. Your health care provider will recommend vaccines for you based on your age, medical history, and lifestyle or other factors, such as travel or where you work. What tests do I need? Screening Your health care provider may recommend screening tests for certain conditions. This may include: Pelvic exam and Pap test. Lipid and cholesterol levels. Diabetes screening. This is done by checking your blood sugar (glucose) after you have not eaten for a while (fasting). Hepatitis B test. Hepatitis C test. HIV (human immunodeficiency virus) test. STI (sexually transmitted infection) testing, if you are at risk. BRCA-related cancer screening. This may be done if you have a family history of breast, ovarian, tubal, or peritoneal cancers. Talk with your health care provider about your test results, treatment options, and if necessary, the need for more tests. Follow these instructions at home: Eating and drinking  Eat a healthy diet that includes fresh fruits and vegetables, whole grains, lean protein, and low-fat dairy products. Take vitamin and mineral supplements as recommended by your health care provider. Do not drink alcohol if: Your health care provider tells you not to drink. You are pregnant, may be pregnant, or are planning to become pregnant. If you drink alcohol: Limit how much you have to 0-1 drink a day. Know how much alcohol is in your drink. In the [place] one drink equals one 12 oz bottle of beer (355 mL), one 5 oz glass of wine (148  mL), or one 1 oz glass of hard liquor (44 mL). Lifestyle Brush your teeth every morning and night with fluoride toothpaste. Floss one time each day. Exercise for at least 30 minutes 5 or more days each week. Do not use any products that contain  nicotine or tobacco. These products include cigarettes, chewing tobacco, and vaping devices, such as e-cigarettes. If you need help quitting, ask your health care provider. Do not use drugs. If you are sexually active, practice safe sex. Use a condom or other form of protection to prevent STIs. If you do not wish to become pregnant, use a form of birth control. If you plan to become pregnant, see your health care provider for a prepregnancy visit. Find healthy ways to manage stress, such as: Meditation, yoga, or listening to music. Journaling. Talking to a trusted person. Spending time with friends and family. Minimize exposure to UV radiation to reduce your risk of skin cancer. Safety Always wear your seat belt while driving or riding in a vehicle. Do not drive: If you have been drinking alcohol. Do not ride with someone who has been drinking. If you have been using any mind-altering substances or drugs. While texting. When you are tired or distracted. Wear a helmet and other protective equipment during sports activities. If you have firearms in your house, make sure you follow all gun safety procedures. Seek help if you have been physically or sexually abused. What's next? Go to your health care provider once a year for an annual wellness visit. Ask your health care provider how often you should have your eyes and teeth checked. Stay up to date on all vaccines. This information is not intended to replace advice given to you by your health care provider. Make sure you discuss any questions you have with your health care provider. Document Revised: 10/09/2020 Document Reviewed: 10/09/2020 Elsevier Patient Education  [place].

## 2022-01-12 NOTE — Addendum Note (Signed)
Addended by: Jeronimo Greaves on: 01/12/2022 01:43 PM   Modules accepted: Orders

## 2022-01-12 NOTE — Telephone Encounter (Signed)
Patient called phone went straight to voicemail , left voicemail to go to the ED , also sent pt a mychart message

## 2022-01-12 NOTE — Telephone Encounter (Signed)
CRITICAL VALUE STICKER  CRITICAL VALUE:   Hemoglobin 6.7 Hemocrit 23.8  RECEIVER (on-site recipient of call):  DATE & TIME NOTIFIED:  4:25  MESSENGER (representative from lab): Lavella Lemons   MD NOTIFIED:  TIME OF NOTIFICATION:  RESPONSE:

## 2022-01-12 NOTE — Progress Notes (Addendum)
Subjective:    Patient ID: Rose Berry, female    DOB: 1991-04-21, 31 y.o.   MRN: [id number]  HPI  Pt in for wellness exam if possible but also migraine. Ha..  Pt works for [employer] She exercises  4 days a week. She just started working out 3 weeks. Eating healthy. Nonsmoker. No alchohol. No caffeine beverages.    Pt states her ha seemed to start lat year. Last December 2021 hpi for ha.  "Pt states headaches started around June. 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 She works 10 hour days. HA sometime worsens looking at computer. Some nausea today with mild ha. Recently used excedrin migraine for ha. Notes when gets ha will purposefully get in dark room since light sensitive. Not sound sensitive. No hx of migraine ha."  Pt declines flu vaccine today.  Pt states since this past Thursday morning around beginning of her shift. Lamonte Sakai has persisted. Has to look at computer screens al day from home. Her ha occur every 2 month but do linger 3-4 days   No light sensitivity, no sound sensitivity and nausea/vomiting. Some frontal ha, rt temporal and sometimes back of head/neck area.    Pt seeing gyn on Feb 04, 2022.   Lmp- 01-02-2022.  Review of Systems  Constitutional:  Negative for chills, fatigue and fever.  Respiratory:  Negative for chest tightness, shortness of breath and wheezing.   Cardiovascular:  Negative for chest pain and palpitations.  Gastrointestinal:  Negative for abdominal pain, diarrhea and rectal pain.  Endocrine: Negative for polydipsia.  Genitourinary:  Negative for difficulty urinating, dysuria, flank pain and frequency.  Musculoskeletal:  Negative for back pain.  Skin:  Negative for rash.   Past Medical History:  Diagnosis Date   Anemia    Asthma    UTI (urinary tract infection)      Social History   Socioeconomic History   Marital status: Single    Spouse name: Not on file   Number of children: Not on file   Years of  education: Not on file   Highest education level: Not on file  Occupational History   Not on file  Tobacco Use   Smoking status: Never   Smokeless tobacco: Never  Vaping Use   Vaping Use: Never used  Substance and Sexual Activity   Alcohol use: No   Drug use: No   Sexual activity: Not Currently    Birth control/protection: Condom  Other Topics Concern   Not on file  Social History Narrative   Not on file   Social Determinants of Health   Financial Resource Strain: Not on file  Food Insecurity: Not on file  Transportation Needs: Not on file  Physical Activity: Not on file  Stress: Not on file  Social Connections: Not on file  Intimate Partner Violence: Not on file    No past surgical history on file.  Family History  Problem Relation Age of Onset   Arthritis Mother    Hypertension Mother    Hyperlipidemia Maternal Grandmother    Hypertension Maternal Grandmother    Diabetes Maternal Grandmother    Kidney disease Maternal Grandmother    Hyperlipidemia Maternal Grandfather    Hypertension Maternal Grandfather    Diabetes Maternal Grandfather    Kidney disease Maternal Grandfather    Hyperlipidemia Paternal Grandmother    Hypertension Paternal Grandmother    Diabetes Paternal Grandmother    Kidney disease Paternal Grandmother    Hyperlipidemia Paternal  Grandfather    Hypertension Paternal Grandfather    Diabetes Paternal Grandfather    Kidney disease Paternal Grandfather     No Known Allergies  Current Outpatient Medications on File Prior to Visit  Medication Sig Dispense Refill   Multiple Vitamins-Minerals (MULTIVITAMIN GUMMIES WOMENS) CHEW Chew 2 each by mouth daily.     Fe Fum-FePoly-FA-Vit C-Vit B3 (INTEGRA F) 125-1 MG CAPS Take 1 capsule by mouth daily. (Patient not taking: Reported on 01/12/2022) 30 capsule 2   No current facility-administered medications on file prior to visit.    BP 129/79   Pulse 68   Temp 98.6 F (37 C) (Oral)   Ht '5\' 2"'$  (1.575  m)   Wt 179 lb (81.2 kg)   LMP 01/02/2022   SpO2 99%   BMI 32.74 kg/m        Objective:   Physical Exam  General Mental Status- Alert. General Appearance- Not in acute distress.   Skin General: Color- Normal Color. Moisture- Normal Moisture.  Neck Carotid Arteries- Normal color. Moisture- Normal Moisture. No carotid bruits. No JVD.  Chest and Lung Exam Auscultation: Breath Sounds:-Normal.  Cardiovascular Auscultation:Rythm- Regular. Murmurs & Other Heart Sounds:Auscultation of the heart reveals- No Murmurs.  Abdomen Inspection:-Inspeection Normal. Palpation/Percussion:Note:No mass. Palpation and Percussion of the abdomen reveal- Non Tender, Non Distended + BS, no rebound or guarding.    Neurologic Cranial Nerve exam:- CN III-XII intact(No nystagmus), symmetric smile. Strength:- 5/5 equal and symmetric strength both upper and lower extremities.  Finger to nose normal. No hand drift.  Rt temporal area ha at times. No dilated veins on inpsection.    Assessment & Plan:   Patient Instructions  For you wellness exam today I have ordered cbc, cmp and lipid panel. Labs include hiv screen  Vaccine given today tdap.  Recommend exercise and healthy diet.  We will let you know lab results as they come in.  Follow up date appointment will be determined after lab review.    For headache we gave toradol 60 mg IM. Left me know by tomorrow if ha has subsided. If ha does not improve, worsens or changes then be seen in ED.  Rare sporadic ha every 2-3 month. Limited rx fiorcet. Need update on how this works for future ha.   Updates important as might get imaging or refer to neurologist.  For anemia added iron level. Will follow gyn notes when they send regarding fibroids.     Mackie Pai, [place]    [id number] charge as addressed ha, gave toradol injection, fiorcet injection and ordered sed rate. Also iron level for anemia.

## 2022-01-13 LAB — HIV ANTIBODY (ROUTINE TESTING W REFLEX): HIV 1&2 Ab, 4th Generation: NONREACTIVE

## 2022-01-14 ENCOUNTER — Ambulatory Visit: Payer: 59 | Admitting: Obstetrics & Gynecology

## 2022-01-15 MED ORDER — IRON (FERROUS SULFATE) 325 (65 FE) MG PO TABS: ORAL_TABLET | ORAL | 0 refills | Status: DC

## 2022-01-15 NOTE — Addendum Note (Signed)
Addended by: Anabel Halon on: 01/15/2022 06:35 AM   Modules accepted: Orders

## 2022-02-04 ENCOUNTER — Other Ambulatory Visit (INDEPENDENT_AMBULATORY_CARE_PROVIDER_SITE_OTHER): Payer: 59

## 2022-02-04 ENCOUNTER — Ambulatory Visit (INDEPENDENT_AMBULATORY_CARE_PROVIDER_SITE_OTHER): Payer: 59 | Admitting: Obstetrics & Gynecology

## 2022-02-04 ENCOUNTER — Other Ambulatory Visit (HOSPITAL_COMMUNITY)
Admission: RE | Admit: 2022-02-04 | Discharge: 2022-02-04 | Disposition: A | Discharge status: Home / Self Care | Payer: 59 | Source: Ambulatory Visit | Attending: Obstetrics & Gynecology | Admitting: Obstetrics & Gynecology

## 2022-02-04 ENCOUNTER — Encounter: Payer: Self-pay | Admitting: Obstetrics & Gynecology

## 2022-02-04 VITALS — BP 107/73 | HR 67 | Ht 62.0 in | Wt 180.0 lb

## 2022-02-04 DIAGNOSIS — Z113 Encounter for screening for infections with a predominantly sexual mode of transmission: Secondary | ICD-10-CM

## 2022-02-04 DIAGNOSIS — Z01419 Encounter for gynecological examination (general) (routine) without abnormal findings: Secondary | ICD-10-CM | POA: Diagnosis present

## 2022-02-04 DIAGNOSIS — D500 Iron deficiency anemia secondary to blood loss (chronic): Secondary | ICD-10-CM

## 2022-02-04 DIAGNOSIS — D649 Anemia, unspecified: Secondary | ICD-10-CM

## 2022-02-04 LAB — CBC WITH DIFFERENTIAL/PLATELET
Basophils Absolute: 0 10*3/uL (ref 0.0–0.1)
Basophils Relative: 0.7 % (ref 0.0–3.0)
Eosinophils Absolute: 0.1 10*3/uL (ref 0.0–0.7)
Eosinophils Relative: 1.5 % (ref 0.0–5.0)
HCT: 32.8 % — ABNORMAL LOW (ref 36.0–46.0)
Hemoglobin: 10 g/dL — ABNORMAL LOW (ref 12.0–15.0)
Lymphocytes Relative: 35.3 % (ref 12.0–46.0)
Lymphs Abs: 1.6 10*3/uL (ref 0.7–4.0)
MCHC: 30.3 g/dL (ref 30.0–36.0)
MCV: 69.9 fl — ABNORMAL LOW (ref 78.0–100.0)
Monocytes Absolute: 0.3 10*3/uL (ref 0.1–1.0)
Monocytes Relative: 6.5 % (ref 3.0–12.0)
Neutro Abs: 2.6 10*3/uL (ref 1.4–7.7)
Neutrophils Relative %: 56 % (ref 43.0–77.0)
Platelets: 368 10*3/uL (ref 150.0–400.0)
RBC: 4.7 Mil/uL (ref 3.87–5.11)
RDW: 38.1 % — ABNORMAL HIGH (ref 11.5–15.5)
WBC: 4.6 10*3/uL (ref 4.0–10.5)

## 2022-02-04 LAB — IRON: Iron: 115 ug/dL (ref 42–145)

## 2022-02-04 NOTE — Progress Notes (Signed)
Subjective:     Rose Berry is a 31 y.o. female here for a routine exam.  Current complaints: h/o AUB due to fibroids.   Pt reports that she passed out and was taken to the hosp. Her hgb was 4. She received a blood transfusion.       Gynecologic History Patient's last menstrual period was 01/28/2022 (exact date). Contraception: condoms Last Pap: 12/01/2018. Results were: normal Last mammogram: n/a.   Obstetric History OB History  Gravida Para Term Preterm AB Living  0 0 0 0 0 0  SAB IAB Ectopic Multiple Live Births  0 0 0 0 0   The following portions of the patient's history were reviewed and updated as appropriate: allergies, current medications, past family history, past medical history, past social history, past surgical history, and problem list.  Review of Systems Pertinent items are noted in HPI.    Objective:  BP 107/73   Pulse 67   Ht '5\' 2"'$  (1.575 m)   Wt 180 lb (81.6 kg)   LMP 01/28/2022 (Exact Date)   BMI 32.92 kg/m  General Appearance:    Alert, cooperative, no distress, appears stated age  Head:    Normocephalic, without obvious abnormality, atraumatic  Eyes:    conjunctiva/corneas clear, EOM's intact, both eyes  Ears:    Normal external ear canals, both ears  Nose:   Nares normal, septum midline, mucosa normal, no drainage    or sinus tenderness  Throat:   Lips, mucosa, and tongue normal; teeth and gums normal  Neck:   Supple, symmetrical, trachea midline, no adenopathy;    thyroid:  no enlargement/tenderness/nodules  Back:     Symmetric, no curvature, ROM normal, no CVA tenderness  Lungs:     respirations unlabored  Chest Wall:    No tenderness or deformity   Heart:    Regular rate and rhythm  Breast Exam:    No tenderness, masses, or nipple abnormality  Abdomen:     Soft, non-tender, bowel sounds active all four quadrants,    no masses, no organomegaly  Genitalia:    Normal female without lesion, discharge or tenderness   18 week sized uterus.    Extremities:   Extremities normal, atraumatic, no cyanosis or edema  Pulses:   2+ and symmetric all extremities  Skin:   Skin color, texture, turgor normal, no rashes or lesions     Assessment:    Healthy female exam.  Enlarging uterine fibroids    Plan:   Diagnoses and all orders for this visit:  Well female exam with routine gynecological exam -     Cytology - PAP( [facility])  Routine screening for STI (sexually transmitted infection) -     Cancel: Hepatitis C Antibody -     Cancel: Hepatitis C Antibody -     Cancel: RPR -     Hepatitis C Antibody -     Hepatitis B Surface AntiGEN -     RPR  Anemia, unspecified type -     Iron -     CBC w/Diff  Chronic blood loss anemia -     CBC   Patient desires surgical management with myomectomy via laparotomy.  The risks of surgery were discussed in detail with the patient including but not limited to: bleeding which may require transfusion or reoperation; infection which may require prolonged hospitalization or re-hospitalization and antibiotic therapy; injury to bowel, bladder, ureters and major vessels or other surrounding organs; need for additional procedures including  laparotomy; thromboembolic phenomenon, incisional problems and other postoperative or anesthesia complications.  Patient was told that the likelihood that her condition and symptoms will be treated effectively with this surgical management was very high; the postoperative expectations were also discussed in detail. The patient also understands the alternative treatment options which were discussed in full. All questions were answered.  She was told that she will be contacted by our surgical scheduler regarding the time and date of her surgery; routine preoperative instructions of having nothing to eat or drink after midnight on the day prior to surgery and also coming to the hospital 1 1/2 hours prior to her time of surgery were also emphasized.  She was told she may be  called for a preoperative appointment about a week prior to surgery and will be given further preoperative instructions at that visit. Printed patient education handouts about the procedure were given to the patient to review at home.   Ly Wass L. Harraway-Smith, M.D., [place]

## 2022-02-05 LAB — HEPATITIS B SURFACE ANTIGEN: Hepatitis B Surface Ag: NEGATIVE

## 2022-02-05 LAB — HEPATITIS C ANTIBODY: Hep C Virus Ab: NONREACTIVE

## 2022-02-05 LAB — RPR: RPR Ser Ql: NONREACTIVE

## 2022-02-07 ENCOUNTER — Other Ambulatory Visit: Payer: Self-pay | Admitting: Medical

## 2022-02-09 ENCOUNTER — Encounter: Payer: Self-pay | Admitting: Obstetrics & Gynecology

## 2022-02-10 LAB — CYTOLOGY - PAP
Chlamydia: NEGATIVE
Comment: NEGATIVE
Comment: NEGATIVE
Comment: NEGATIVE
Comment: NORMAL
Diagnosis: NEGATIVE
High risk HPV: NEGATIVE
Neisseria Gonorrhea: NEGATIVE
Trichomonas: NEGATIVE

## 2022-02-20 NOTE — Progress Notes (Signed)
Surgical Instructions    Your procedure is scheduled on Tuesday November 7.  Report to [facility] Main Entrance "A" at 5:30 A.M., then check in with the Admitting office.  Call this number if you have problems the morning of surgery:  [phone]   If you have any questions prior to your surgery date call [phone]: Open Monday-Friday 8am-4pm If you experience any cold or flu symptoms such as cough, fever, chills, shortness of breath, etc. between now and your scheduled surgery, please notify us at the above number     Remember:  Do not eat after midnight the night before your surgery  You may drink clear liquids until 4:30AM the morning of your surgery.   Clear liquids allowed are: Water, Non-Citrus Juices (without pulp), Carbonated Beverages, Clear Tea, Black Coffee ONLY (NO MILK, CREAM OR POWDERED CREAMER of any kind), and Gatorade    Take these medicines the morning of surgery with A SIP OF WATER: NONE   As of today, STOP taking any Aspirin (unless otherwise instructed by your surgeon) Aleve, Naproxen, Ibuprofen, Motrin, Advil, Goody's, BC's, all herbal medications, fish oil, and all vitamins.            [facility] is not responsible for any belongings or valuables.    Do NOT Smoke (Tobacco/Vaping)  24 hours prior to your procedure  If you use a CPAP at night, you may bring your mask for your overnight stay.   Contacts, glasses, hearing aids, dentures or partials may not be worn into surgery, please bring cases for these belongings   For patients admitted to the hospital, discharge time will be determined by your treatment team.   Patients discharged the day of surgery will not be allowed to drive home, and someone needs to stay with them for 24 hours.   SURGICAL WAITING ROOM VISITATION Patients having surgery or a procedure may have no more than 2 support people in the waiting area - these visitors may rotate.   Children under the age of 57 must have an adult with  them who is not the patient. If the patient needs to stay at the hospital during part of their recovery, the visitor guidelines for inpatient rooms apply. Pre-op nurse will coordinate an appropriate time for 1 support person to accompany patient in pre-op.  This support person may not rotate.   Please refer to [email or web address] for the visitor guidelines for Inpatients (after your surgery is over and you are in a regular room).    Special instructions:    Oral Hygiene is also important to reduce your risk of infection.  Remember - BRUSH YOUR TEETH THE MORNING OF SURGERY WITH YOUR REGULAR TOOTHPASTE   [facility]- Preparing For Surgery  Before surgery, you can play an important role. Because skin is not sterile, your skin needs to be as free of germs as possible. You can reduce the number of germs on your skin by washing with CHG (chlorahexidine gluconate) Soap before surgery.  CHG is an antiseptic cleaner which kills germs and bonds with the skin to continue killing germs even after washing.     Please do not use if you have an allergy to CHG or antibacterial soaps. If your skin becomes reddened/irritated stop using the CHG.  Do not shave (including legs and underarms) for at least 48 hours prior to first CHG shower. It is OK to shave your face.  Please follow these instructions carefully.     Shower the [employer] BEFORE SURGERY  and the MORNING OF SURGERY with CHG Soap.   If you chose to wash your hair, wash your hair first as usual with your normal shampoo. After you shampoo, rinse your hair and body thoroughly to remove the shampoo.  Then [employer] and genitals (private parts) with your normal soap and rinse thoroughly to remove soap.  After that Use CHG Soap as you would any other liquid soap. You can apply CHG directly to the skin and wash gently with a scrungie or a clean washcloth.   Apply the CHG Soap to your body ONLY FROM THE NECK  DOWN.  Do not use on open wounds or open sores. Avoid contact with your eyes, ears, mouth and genitals (private parts). Wash Face and genitals (private parts)  with your normal soap.   Wash thoroughly, paying special attention to the area where your surgery will be performed.  Thoroughly rinse your body with warm water from the neck down.  DO NOT shower/wash with your normal soap after using and rinsing off the CHG Soap.  Pat yourself dry with a CLEAN TOWEL.  Wear CLEAN PAJAMAS to bed the night before surgery  Place CLEAN SHEETS on your bed the night before your surgery  DO NOT SLEEP WITH PETS.   Day of Surgery:  Take a shower with CHG soap. Wear Clean/Comfortable clothing the morning of surgery Do not wear jewelry or makeup. Do not wear lotions, powders, perfumes/cologne or deodorant. Do not shave 48 hours prior to surgery.  Men may shave face and neck. Do not bring valuables to the hospital. Do not wear nail polish, gel polish, artificial nails, or any other type of covering on natural nails (fingers and toes) If you have artificial nails or gel coating that need to be removed by a nail salon, please have this removed prior to surgery. Artificial nails or gel coating may interfere with anesthesia's ability to adequately monitor your vital signs. Remember to brush your teeth WITH YOUR REGULAR TOOTHPASTE.    If you received a COVID test during your pre-op visit, it is requested that you wear a mask when out in public, stay away from anyone that may not be feeling well, and notify your surgeon if you develop symptoms. If you have been in contact with anyone that has tested positive in the last 10 days, please notify your surgeon.    Please read over the following fact sheets that you were given.

## 2022-02-23 ENCOUNTER — Encounter: Payer: Self-pay | Admitting: Obstetrics & Gynecology

## 2022-02-23 ENCOUNTER — Encounter (HOSPITAL_COMMUNITY): Payer: Self-pay

## 2022-02-23 ENCOUNTER — Other Ambulatory Visit: Payer: Self-pay

## 2022-02-23 ENCOUNTER — Encounter (HOSPITAL_COMMUNITY)
Admission: RE | Admit: 2022-02-23 | Discharge: 2022-02-23 | Disposition: A | Discharge status: Home / Self Care | Payer: 59 | Source: Ambulatory Visit | Attending: Obstetrics & Gynecology | Admitting: Obstetrics & Gynecology

## 2022-02-23 VITALS — BP 124/80 | HR 70 | Temp 97.5°F | Resp 17 | Ht 62.0 in | Wt 174.4 lb

## 2022-02-23 DIAGNOSIS — R102 Pelvic and perineal pain: Secondary | ICD-10-CM

## 2022-02-23 DIAGNOSIS — Z01812 Encounter for preprocedural laboratory examination: Secondary | ICD-10-CM | POA: Insufficient documentation

## 2022-02-23 DIAGNOSIS — Z01818 Encounter for other preprocedural examination: Secondary | ICD-10-CM

## 2022-02-23 DIAGNOSIS — D219 Benign neoplasm of connective and other soft tissue, unspecified: Secondary | ICD-10-CM | POA: Diagnosis not present

## 2022-02-23 HISTORY — DX: Family history of other specified conditions: Z84.89

## 2022-02-23 LAB — CBC
HCT: 38.1 % (ref 36.0–46.0)
Hemoglobin: 11.4 g/dL — ABNORMAL LOW (ref 12.0–15.0)
MCH: 23.8 pg — ABNORMAL LOW (ref 26.0–34.0)
MCHC: 29.9 g/dL — ABNORMAL LOW (ref 30.0–36.0)
MCV: 79.7 fL — ABNORMAL LOW (ref 80.0–100.0)
Platelets: 229 10*3/uL (ref 150–400)
RBC: 4.78 MIL/uL (ref 3.87–5.11)
WBC: 5.5 10*3/uL (ref 4.0–10.5)
nRBC: 0 % (ref 0.0–0.2)

## 2022-02-23 LAB — TYPE AND SCREEN
ABO/RH(D): B POS
Antibody Screen: NEGATIVE

## 2022-02-23 NOTE — Progress Notes (Signed)
PCP - Saguier, Percell Miller, PA-C Cardiologist - denies  PPM/ICD - denies   Chest x-ray - N/A EKG - N/A Stress Test - denies ECHO - denies Cardiac Cath - denies  Sleep Study - denies   Blood Thinner Instructions: N/A Aspirin Instructions:N/A  ERAS Protcol -ERAS per order   COVID TEST- N/A  Pt last hgb 10 on 02/04/22. Previous Hgb 6.7 on 01/12/22. Pt reports she has been prescribed Iron to take after her result from 01/12/22. Pt reports she has been transfused once a year due to anemia, but decided to take iron instead this month. Order in epic to get CBC if not done within 72 hour of surgery.  Pt states she is on her period currently and states she is having "heavy bleeding" which is normal on this day of her period. I spoke with Karoline Caldwell, PA d/t history of anemia. Per Jeneen Rinks ok to draw CBC today d/t history of recent low Hgb, and repeat DOS if requested by surgeon.    Anesthesia review: see above  Patient denies shortness of breath, fever, cough and chest pain at PAT appointment   All instructions explained to the patient, with a verbal understanding of the material. Patient agrees to go over the instructions while at home for a better understanding. Patient also instructed to self quarantine after being tested for COVID-19. The opportunity to ask questions was provided.

## 2022-02-24 ENCOUNTER — Other Ambulatory Visit (HOSPITAL_COMMUNITY): Payer: 59

## 2022-03-03 ENCOUNTER — Inpatient Hospital Stay (HOSPITAL_COMMUNITY): Payer: 59 | Admitting: Physician Assistant

## 2022-03-03 ENCOUNTER — Other Ambulatory Visit: Payer: Self-pay

## 2022-03-03 ENCOUNTER — Encounter (HOSPITAL_COMMUNITY)
Admission: RE | Disposition: A | Discharge status: Home / Self Care | Payer: Self-pay | Source: Home / Self Care | Attending: Obstetrics & Gynecology

## 2022-03-03 ENCOUNTER — Encounter (HOSPITAL_COMMUNITY): Payer: Self-pay | Admitting: Obstetrics & Gynecology

## 2022-03-03 ENCOUNTER — Inpatient Hospital Stay (HOSPITAL_COMMUNITY)
Admission: RE | Admit: 2022-03-03 | Discharge: 2022-03-05 | DRG: 742 | Disposition: A | Discharge status: Home / Self Care | Payer: 59 | Attending: Obstetrics & Gynecology | Admitting: Obstetrics & Gynecology

## 2022-03-03 DIAGNOSIS — D259 Leiomyoma of uterus, unspecified: Secondary | ICD-10-CM | POA: Diagnosis present

## 2022-03-03 DIAGNOSIS — D251 Intramural leiomyoma of uterus: Secondary | ICD-10-CM

## 2022-03-03 DIAGNOSIS — N939 Abnormal uterine and vaginal bleeding, unspecified: Secondary | ICD-10-CM | POA: Diagnosis present

## 2022-03-03 DIAGNOSIS — R102 Pelvic and perineal pain: Secondary | ICD-10-CM | POA: Diagnosis present

## 2022-03-03 DIAGNOSIS — Z9889 Other specified postprocedural states: Secondary | ICD-10-CM

## 2022-03-03 DIAGNOSIS — N920 Excessive and frequent menstruation with regular cycle: Secondary | ICD-10-CM

## 2022-03-03 DIAGNOSIS — D250 Submucous leiomyoma of uterus: Secondary | ICD-10-CM | POA: Diagnosis not present

## 2022-03-03 DIAGNOSIS — N938 Other specified abnormal uterine and vaginal bleeding: Secondary | ICD-10-CM

## 2022-03-03 DIAGNOSIS — D62 Acute posthemorrhagic anemia: Secondary | ICD-10-CM | POA: Diagnosis not present

## 2022-03-03 DIAGNOSIS — Z6838 Body mass index (BMI) 38.0-38.9, adult: Secondary | ICD-10-CM | POA: Diagnosis not present

## 2022-03-03 DIAGNOSIS — E669 Obesity, unspecified: Secondary | ICD-10-CM | POA: Diagnosis present

## 2022-03-03 DIAGNOSIS — N736 Female pelvic peritoneal adhesions (postinfective): Secondary | ICD-10-CM

## 2022-03-03 DIAGNOSIS — D219 Benign neoplasm of connective and other soft tissue, unspecified: Secondary | ICD-10-CM

## 2022-03-03 DIAGNOSIS — Z01818 Encounter for other preprocedural examination: Secondary | ICD-10-CM

## 2022-03-03 HISTORY — PX: MYOMECTOMY: SHX85

## 2022-03-03 LAB — CBC
HCT: 36.7 % (ref 36.0–46.0)
HCT: 29.9 % — ABNORMAL LOW (ref 36.0–46.0)
HCT: 28.2 % — ABNORMAL LOW (ref 36.0–46.0)
Hemoglobin: 11.6 g/dL — ABNORMAL LOW (ref 12.0–15.0)
Hemoglobin: 9.5 g/dL — ABNORMAL LOW (ref 12.0–15.0)
Hemoglobin: 8.9 g/dL — ABNORMAL LOW (ref 12.0–15.0)
MCH: 24.6 pg — ABNORMAL LOW (ref 26.0–34.0)
MCH: 24.9 pg — ABNORMAL LOW (ref 26.0–34.0)
MCH: 24.9 pg — ABNORMAL LOW (ref 26.0–34.0)
MCHC: 31.6 g/dL (ref 30.0–36.0)
MCHC: 31.8 g/dL (ref 30.0–36.0)
MCHC: 31.6 g/dL (ref 30.0–36.0)
MCV: 77.8 fL — ABNORMAL LOW (ref 80.0–100.0)
MCV: 78.3 fL — ABNORMAL LOW (ref 80.0–100.0)
MCV: 78.8 fL — ABNORMAL LOW (ref 80.0–100.0)
Platelets: 289 10*3/uL (ref 150–400)
Platelets: 257 10*3/uL (ref 150–400)
Platelets: 251 10*3/uL (ref 150–400)
RBC: 4.72 MIL/uL (ref 3.87–5.11)
RBC: 3.82 MIL/uL — ABNORMAL LOW (ref 3.87–5.11)
RBC: 3.58 MIL/uL — ABNORMAL LOW (ref 3.87–5.11)
RDW: 26 % — ABNORMAL HIGH (ref 11.5–15.5)
RDW: 26.1 % — ABNORMAL HIGH (ref 11.5–15.5)
WBC: 6 10*3/uL (ref 4.0–10.5)
WBC: 19.2 10*3/uL — ABNORMAL HIGH (ref 4.0–10.5)
WBC: 11.2 10*3/uL — ABNORMAL HIGH (ref 4.0–10.5)
nRBC: 0 % (ref 0.0–0.2)
nRBC: 0 % (ref 0.0–0.2)
nRBC: 0 % (ref 0.0–0.2)

## 2022-03-03 LAB — POCT PREGNANCY, URINE: Preg Test, Ur: NEGATIVE

## 2022-03-03 LAB — ABO/RH: ABO/RH(D): B POS

## 2022-03-03 SURGERY — MYOMECTOMY, ABDOMINAL APPROACH
Anesthesia: General | Site: Abdomen

## 2022-03-03 MED ORDER — BISACODYL 10 MG RE SUPP: 10.0000 mg | Freq: Every day | RECTAL | Status: DC | PRN

## 2022-03-03 MED ORDER — DEXAMETHASONE SODIUM PHOSPHATE 4 MG/ML IJ SOLN
INTRAMUSCULAR | Status: DC | PRN
  Administered 2022-03-03: 10 mg via INTRAVENOUS

## 2022-03-03 MED ORDER — POLYETHYLENE GLYCOL 3350 17 G PO PACK: 17.0000 g | PACK | Freq: Every day | ORAL | Status: DC | PRN

## 2022-03-03 MED ORDER — ORAL CARE MOUTH RINSE: 15.0000 mL | Freq: Once | OROMUCOSAL | Status: AC

## 2022-03-03 MED ORDER — DEXTROSE-NACL 5-0.45 % IV SOLN: INTRAVENOUS | Status: DC

## 2022-03-03 MED ORDER — KETOROLAC TROMETHAMINE 30 MG/ML IJ SOLN
30.0000 mg | Freq: Four times a day (QID) | INTRAMUSCULAR | Status: AC
  Administered 2022-03-03 – 2022-03-04 (×3): 30 mg via INTRAVENOUS
  Filled 2022-03-03 (×3): qty 1

## 2022-03-03 MED ORDER — DOCUSATE SODIUM 100 MG PO CAPS
100.0000 mg | ORAL_CAPSULE | Freq: Two times a day (BID) | ORAL | Status: DC
  Administered 2022-03-04 – 2022-03-05 (×3): 100 mg via ORAL
  Filled 2022-03-03 (×4): qty 1

## 2022-03-03 MED ORDER — FENTANYL CITRATE (PF) 100 MCG/2ML IJ SOLN
INTRAMUSCULAR | Status: AC
  Filled 2022-03-03: qty 2

## 2022-03-03 MED ORDER — OXYCODONE-ACETAMINOPHEN 5-325 MG PO TABS
1.0000 | ORAL_TABLET | ORAL | Status: DC | PRN
  Administered 2022-03-04 – 2022-03-05 (×3): 1 via ORAL
  Filled 2022-03-03 (×3): qty 1

## 2022-03-03 MED ORDER — VASOPRESSIN 20 UNIT/ML IV SOLN
INTRAVENOUS | Status: DC | PRN
  Administered 2022-03-03: 20 mL via INTRAUTERINE

## 2022-03-03 MED ORDER — FENTANYL CITRATE (PF) 100 MCG/2ML IJ SOLN
INTRAMUSCULAR | Status: DC | PRN
  Administered 2022-03-03: 50 ug via INTRAVENOUS
  Administered 2022-03-03: 100 ug via INTRAVENOUS
  Administered 2022-03-03: 50 ug via INTRAVENOUS
  Administered 2022-03-03: 25 ug via INTRAVENOUS

## 2022-03-03 MED ORDER — LIDOCAINE-EPINEPHRINE (PF) 1.5 %-1:200000 IJ SOLN
INTRAMUSCULAR | Status: DC | PRN
  Administered 2022-03-03 (×2): 5 mL via PERINEURAL

## 2022-03-03 MED ORDER — ACETAMINOPHEN 160 MG/5ML PO SOLN: 1000.0000 mg | Freq: Once | ORAL | Status: DC | PRN

## 2022-03-03 MED ORDER — ESMOLOL HCL 100 MG/10ML IV SOLN
INTRAVENOUS | Status: DC | PRN
  Administered 2022-03-03: 20 mg via INTRAVENOUS
  Administered 2022-03-03 (×2): 10 mg via INTRAVENOUS

## 2022-03-03 MED ORDER — BUPIVACAINE-EPINEPHRINE (PF) 0.25% -1:200000 IJ SOLN
INTRAMUSCULAR | Status: DC | PRN
  Administered 2022-03-03 (×2): 5 mL via PERINEURAL

## 2022-03-03 MED ORDER — MIDAZOLAM HCL 2 MG/2ML IJ SOLN
INTRAMUSCULAR | Status: AC
  Filled 2022-03-03: qty 2

## 2022-03-03 MED ORDER — TRAMADOL HCL 50 MG PO TABS: 50.0000 mg | ORAL_TABLET | Freq: Four times a day (QID) | ORAL | Status: DC | PRN

## 2022-03-03 MED ORDER — IBUPROFEN 600 MG PO TABS
600.0000 mg | ORAL_TABLET | Freq: Four times a day (QID) | ORAL | Status: DC
  Administered 2022-03-04 – 2022-03-05 (×4): 600 mg via ORAL
  Filled 2022-03-03 (×4): qty 1

## 2022-03-03 MED ORDER — PROPOFOL 10 MG/ML IV BOLUS
INTRAVENOUS | Status: AC
  Filled 2022-03-03: qty 20

## 2022-03-03 MED ORDER — BUPIVACAINE HCL (PF) 0.5 % IJ SOLN
INTRAMUSCULAR | Status: AC
  Filled 2022-03-03: qty 30

## 2022-03-03 MED ORDER — PROPOFOL 10 MG/ML IV BOLUS
INTRAVENOUS | Status: DC | PRN
  Administered 2022-03-03: 200 mg via INTRAVENOUS

## 2022-03-03 MED ORDER — ZOLPIDEM TARTRATE 5 MG PO TABS: 5.0000 mg | ORAL_TABLET | Freq: Every evening | ORAL | Status: DC | PRN

## 2022-03-03 MED ORDER — SUGAMMADEX SODIUM 200 MG/2ML IV SOLN
INTRAVENOUS | Status: DC | PRN
  Administered 2022-03-03: 200 mg via INTRAVENOUS

## 2022-03-03 MED ORDER — POVIDONE-IODINE 10 % EX SWAB
2.0000 | Freq: Once | CUTANEOUS | Status: AC
  Administered 2022-03-03: 2 via TOPICAL

## 2022-03-03 MED ORDER — ACETAMINOPHEN 10 MG/ML IV SOLN: 1000.0000 mg | Freq: Once | INTRAVENOUS | Status: DC | PRN

## 2022-03-03 MED ORDER — PANTOPRAZOLE SODIUM 40 MG PO TBEC
40.0000 mg | DELAYED_RELEASE_TABLET | Freq: Every day | ORAL | Status: DC
  Administered 2022-03-04 – 2022-03-05 (×2): 40 mg via ORAL
  Filled 2022-03-03 (×2): qty 1

## 2022-03-03 MED ORDER — BUPIVACAINE-EPINEPHRINE (PF) 0.5% -1:200000 IJ SOLN
INTRAMUSCULAR | Status: DC | PRN
  Administered 2022-03-03 (×2): 15 mL via PERINEURAL

## 2022-03-03 MED ORDER — VASOPRESSIN 20 UNIT/ML IV SOLN
INTRAVENOUS | Status: AC
  Filled 2022-03-03: qty 1

## 2022-03-03 MED ORDER — CEFAZOLIN SODIUM-DEXTROSE 2-4 GM/100ML-% IV SOLN
2.0000 g | INTRAVENOUS | Status: AC
  Administered 2022-03-03: 2 g via INTRAVENOUS
  Filled 2022-03-03: qty 100

## 2022-03-03 MED ORDER — ONDANSETRON HCL 4 MG/2ML IJ SOLN
INTRAMUSCULAR | Status: DC | PRN
  Administered 2022-03-03: 4 mg via INTRAVENOUS

## 2022-03-03 MED ORDER — BUPIVACAINE HCL (PF) 0.25 % IJ SOLN
INTRAMUSCULAR | Status: AC
  Filled 2022-03-03: qty 30

## 2022-03-03 MED ORDER — ACETAMINOPHEN 500 MG PO TABS: 1000.0000 mg | ORAL_TABLET | Freq: Once | ORAL | Status: DC | PRN

## 2022-03-03 MED ORDER — FENTANYL CITRATE (PF) 250 MCG/5ML IJ SOLN
INTRAMUSCULAR | Status: AC
  Filled 2022-03-03: qty 5

## 2022-03-03 MED ORDER — CHLORHEXIDINE GLUCONATE 0.12 % MT SOLN
15.0000 mL | Freq: Once | OROMUCOSAL | Status: AC
  Administered 2022-03-03: 15 mL via OROMUCOSAL
  Filled 2022-03-03: qty 15

## 2022-03-03 MED ORDER — LACTATED RINGERS IV SOLN: INTRAVENOUS | Status: DC

## 2022-03-03 MED ORDER — LIDOCAINE 2% (20 MG/ML) 5 ML SYRINGE
INTRAMUSCULAR | Status: DC | PRN
  Administered 2022-03-03: 60 mg via INTRAVENOUS

## 2022-03-03 MED ORDER — ONDANSETRON HCL 4 MG PO TABS: 4.0000 mg | ORAL_TABLET | Freq: Four times a day (QID) | ORAL | Status: DC | PRN

## 2022-03-03 MED ORDER — MIDAZOLAM HCL 5 MG/5ML IJ SOLN
INTRAMUSCULAR | Status: DC | PRN
  Administered 2022-03-03: 2 mg via INTRAVENOUS

## 2022-03-03 MED ORDER — ACETAMINOPHEN 500 MG PO TABS
1000.0000 mg | ORAL_TABLET | ORAL | Status: AC
  Administered 2022-03-03: 1000 mg via ORAL
  Filled 2022-03-03: qty 2

## 2022-03-03 MED ORDER — HEMOSTATIC AGENTS (NO CHARGE) OPTIME
TOPICAL | Status: DC | PRN
  Administered 2022-03-03: 1 via TOPICAL

## 2022-03-03 MED ORDER — SIMETHICONE 80 MG PO CHEW: 80.0000 mg | CHEWABLE_TABLET | Freq: Four times a day (QID) | ORAL | Status: DC | PRN

## 2022-03-03 MED ORDER — ONDANSETRON HCL 4 MG/2ML IJ SOLN
4.0000 mg | Freq: Four times a day (QID) | INTRAMUSCULAR | Status: DC | PRN
  Administered 2022-03-03 (×2): 4 mg via INTRAVENOUS
  Filled 2022-03-03 (×2): qty 2

## 2022-03-03 MED ORDER — FENTANYL CITRATE (PF) 100 MCG/2ML IJ SOLN
25.0000 ug | INTRAMUSCULAR | Status: DC | PRN
  Administered 2022-03-03 (×2): 50 ug via INTRAVENOUS

## 2022-03-03 MED ORDER — ROCURONIUM BROMIDE 10 MG/ML (PF) SYRINGE
PREFILLED_SYRINGE | INTRAVENOUS | Status: DC | PRN
  Administered 2022-03-03: 5 mg via INTRAVENOUS
  Administered 2022-03-03: 70 mg via INTRAVENOUS

## 2022-03-03 MED ORDER — BUPIVACAINE HCL (PF) 0.5 % IJ SOLN
INTRAMUSCULAR | Status: DC | PRN
  Administered 2022-03-03: 10 mL

## 2022-03-03 MED ORDER — HYDROMORPHONE HCL 1 MG/ML IJ SOLN
0.2000 mg | INTRAMUSCULAR | Status: DC | PRN
  Administered 2022-03-03 – 2022-03-04 (×4): 0.6 mg via INTRAVENOUS
  Filled 2022-03-03 (×4): qty 1

## 2022-03-03 MED ORDER — METOCLOPRAMIDE HCL 5 MG/ML IJ SOLN
10.0000 mg | Freq: Four times a day (QID) | INTRAMUSCULAR | Status: DC | PRN
  Administered 2022-03-03: 10 mg via INTRAVENOUS
  Filled 2022-03-03: qty 2

## 2022-03-03 MED ORDER — 0.9 % SODIUM CHLORIDE (POUR BTL) OPTIME
TOPICAL | Status: DC | PRN
  Administered 2022-03-03: 2000 mL

## 2022-03-03 MED ORDER — SCOPOLAMINE 1 MG/3DAYS TD PT72
1.0000 | MEDICATED_PATCH | TRANSDERMAL | Status: DC
  Administered 2022-03-03: 1.5 mg via TRANSDERMAL
  Filled 2022-03-03: qty 1

## 2022-03-03 MED ORDER — OXYCODONE HCL 5 MG PO TABS: 5.0000 mg | ORAL_TABLET | Freq: Once | ORAL | Status: DC | PRN

## 2022-03-03 MED ORDER — KETOROLAC TROMETHAMINE 30 MG/ML IJ SOLN
INTRAMUSCULAR | Status: AC
  Filled 2022-03-03: qty 1

## 2022-03-03 MED ORDER — SOD CITRATE-CITRIC ACID 500-334 MG/5ML PO SOLN
30.0000 mL | ORAL | Status: AC
  Administered 2022-03-03: 30 mL via ORAL
  Filled 2022-03-03: qty 30

## 2022-03-03 MED ORDER — MENTHOL 3 MG MT LOZG: 1.0000 | LOZENGE | OROMUCOSAL | Status: DC | PRN

## 2022-03-03 MED ORDER — KETOROLAC TROMETHAMINE 30 MG/ML IJ SOLN
30.0000 mg | Freq: Once | INTRAMUSCULAR | Status: AC
  Administered 2022-03-03: 30 mg via INTRAVENOUS

## 2022-03-03 MED ORDER — OXYCODONE HCL 5 MG/5ML PO SOLN: 5.0000 mg | Freq: Once | ORAL | Status: DC | PRN

## 2022-03-03 SURGICAL SUPPLY — 43 items
BENZOIN TINCTURE PRP APPL 2/3 (GAUZE/BANDAGES/DRESSINGS) ×1 IMPLANT
CANISTER SUCT 3000ML PPV (MISCELLANEOUS) ×1 IMPLANT
DRAIN PENROSE 0.75X12 (DRAIN) IMPLANT
DRAIN PENROSE 1/4X12 LTX (DRAIN) ×1 IMPLANT
DRAPE CESAREAN BIRTH W POUCH (DRAPES) ×1 IMPLANT
DRSG OPSITE POSTOP 4X10 (GAUZE/BANDAGES/DRESSINGS) ×1 IMPLANT
DURAPREP 26ML APPLICATOR (WOUND CARE) ×1 IMPLANT
GLOVE BIO SURGEON STRL SZ7 (GLOVE) ×1 IMPLANT
GLOVE BIOGEL PI IND STRL 7.0 (GLOVE) ×3 IMPLANT
GOWN STRL REUS W/ TWL LRG LVL3 (GOWN DISPOSABLE) ×2 IMPLANT
GOWN STRL REUS W/ TWL XL LVL3 (GOWN DISPOSABLE) ×1 IMPLANT
GOWN STRL REUS W/TWL LRG LVL3 (GOWN DISPOSABLE) ×2
GOWN STRL REUS W/TWL XL LVL3 (GOWN DISPOSABLE) ×1
KIT TURNOVER KIT B (KITS) ×1 IMPLANT
NDL 18GX1X1/2 (RX/OR ONLY) (NEEDLE) IMPLANT
NEEDLE 18GX1X1/2 (RX/OR ONLY) (NEEDLE) ×1 IMPLANT
NEEDLE HYPO 22GX1.5 SAFETY (NEEDLE) ×2 IMPLANT
NS IRRIG 1000ML POUR BTL (IV SOLUTION) ×1 IMPLANT
PACK ABDOMINAL GYN (CUSTOM PROCEDURE TRAY) ×1 IMPLANT
PAD ARMBOARD 7.5X6 YLW CONV (MISCELLANEOUS) ×2 IMPLANT
PAD OB MATERNITY 4.3X12.25 (PERSONAL CARE ITEMS) ×1 IMPLANT
PENCIL SMOKE EVACUATOR (MISCELLANEOUS) ×1 IMPLANT
RTRCTR C-SECT PINK 25CM LRG (MISCELLANEOUS) IMPLANT
SPECIMEN JAR MEDIUM (MISCELLANEOUS) ×1 IMPLANT
SPIKE FLUID TRANSFER (MISCELLANEOUS) ×1 IMPLANT
SPONGE INTESTINAL PEANUT (DISPOSABLE) IMPLANT
SPONGE T-LAP 18X18 ~~LOC~~+RFID (SPONGE) ×2 IMPLANT
STRIP CLOSURE SKIN 1/2X4 (GAUZE/BANDAGES/DRESSINGS) ×1 IMPLANT
STRIP SURGICAL 1/2 X 6 IN (GAUZE/BANDAGES/DRESSINGS) IMPLANT
SUT VIC AB 0 CT1 18XCR BRD8 (SUTURE) ×1 IMPLANT
SUT VIC AB 0 CT1 27 (SUTURE) ×3
SUT VIC AB 0 CT1 27XBRD ANBCTR (SUTURE) ×6 IMPLANT
SUT VIC AB 0 CT1 8-18 (SUTURE) ×1
SUT VIC AB 3-0 CT1 27 (SUTURE) ×1
SUT VIC AB 3-0 CT1 36 (SUTURE) ×1 IMPLANT
SUT VIC AB 3-0 CT1 TAPERPNT 27 (SUTURE) ×1 IMPLANT
SUT VIC AB 3-0 SH 27 (SUTURE)
SUT VIC AB 3-0 SH 27X BRD (SUTURE) IMPLANT
SUT VIC AB 4-0 KS 27 (SUTURE) ×1 IMPLANT
SYR 3ML LL SCALE MARK (SYRINGE) IMPLANT
SYR CONTROL 10ML LL (SYRINGE) ×2 IMPLANT
TOWEL GREEN STERILE FF (TOWEL DISPOSABLE) ×2 IMPLANT
TRAY FOLEY W/BAG SLVR 14FR (SET/KITS/TRAYS/PACK) ×1 IMPLANT

## 2022-03-03 NOTE — Plan of Care (Signed)

## 2022-03-03 NOTE — Anesthesia Preprocedure Evaluation (Addendum)
Anesthesia Evaluation  Patient identified by MRN, date of birth, ID band Patient awake    Reviewed: Allergy & Precautions, NPO status , Patient's Chart, lab work & pertinent test results  History of Anesthesia Complications Negative for: history of anesthetic complications  Airway Mallampati: I  TM Distance: >3 FB Neck ROM: Full    Dental  (+) Teeth Intact, Dental Advisory Given   Pulmonary neg pulmonary ROS   breath sounds clear to auscultation       Cardiovascular negative cardio ROS  Rhythm:Regular     Neuro/Psych negative neurological ROS  negative psych ROS   GI/Hepatic negative GI ROS, Neg liver ROS,,,  Endo/Other  negative endocrine ROS    Renal/GU negative Renal ROS     Musculoskeletal negative musculoskeletal ROS (+)    Abdominal   Peds  Hematology  (+) Blood dyscrasia, anemia Lab Results      Component                Value               Date                      WBC                      6.0                 03/03/2022                HGB                      11.6 (L)            03/03/2022                HCT                      36.7                03/03/2022                MCV                      77.8 (L)            03/03/2022                PLT                      289                 03/03/2022              Anesthesia Other Findings   Reproductive/Obstetrics Lab Results      Component                Value               Date                      PREGTESTUR               NEGATIVE            03/03/2016  Anesthesia Physical Anesthesia Plan  ASA: 1  Anesthesia Plan: General and Regional   Post-op Pain Management: Tylenol PO (pre-op)*, Toradol IV (intra-op)* and Regional block*   Induction: Intravenous  PONV Risk Score and Plan: 3 and Ondansetron, Dexamethasone and Amisulpride  Airway Management Planned: Oral ETT  Additional  Equipment: None  Intra-op Plan:   Post-operative Plan: Extubation in OR  Informed Consent: I have reviewed the patients History and Physical, chart, labs and discussed the procedure including the risks, benefits and alternatives for the proposed anesthesia with the patient or authorized representative who has indicated his/her understanding and acceptance.     Dental advisory given  Plan Discussed with: CRNA  Anesthesia Plan Comments:        Anesthesia Quick Evaluation

## 2022-03-03 NOTE — Anesthesia Procedure Notes (Signed)
Anesthesia Regional Block: TAP block   Pre-Anesthetic Checklist: , timeout performed,  Correct Patient, Correct Site, Correct Laterality,  Correct Procedure, Correct Position, site marked,  Risks and benefits discussed,  Surgical consent,  Pre-op evaluation,  At surgeon's request and post-op pain management  Laterality: Left  Prep: chloraprep       Needles:  Injection technique: Single-shot      Needle Length: 9cm  Needle Gauge: 22     Additional Needles: Arrow StimuQuik ECHO Echogenic Stimulating PNB Needle  Procedures:,,,, ultrasound used (permanent image in chart),,    Narrative:  Start time: 03/03/2022 7:38 AM End time: 03/03/2022 7:42 AM Injection made incrementally with aspirations every 5 mL.  Performed by: Personally  Anesthesiologist: Oleta Mouse, MD

## 2022-03-03 NOTE — Progress Notes (Signed)
Called to see the patient by nursing staff due to dressing being soaked through. Pt is POD0 s/p abdominal myomectomy with Dr. Cecille Po. Her surgery was uncomplicated, EBL 161 cc.   Dressing removed and some of the steris that were coming off were removed with sterile gloves. No bleeding spontaneously or with expression along the incision line. Incision is well approximated with subcuticular sutures. New steris placed over the incision. New honeycomb placed and pressure dressing applied.   Reviewed with pt if pressure dressing is too uncomfortable, it may be removed after a couple hours.   Reviewed likely from skin edge that bleeding occurred but no longer bleeding. Reassurance provided and all questions answered from pt and family.   Radene Gunning, MD Attending [place], [facility] for [facility], [place]

## 2022-03-03 NOTE — Anesthesia Procedure Notes (Signed)
Procedure Name: Intubation Date/Time: 03/03/2022 7:45 AM  Performed by: Eulas Post, Stoy Fenn W, CRNAPre-anesthesia Checklist: Patient identified, Emergency Drugs available, Suction available and Patient being monitored Patient Re-evaluated:Patient Re-evaluated prior to induction Oxygen Delivery Method: Circle system utilized Preoxygenation: Pre-oxygenation with 100% oxygen Induction Type: IV induction Ventilation: Mask ventilation without difficulty Laryngoscope Size: Miller and 2 Grade View: Grade I Tube type: Oral Tube size: 7.0 mm Number of attempts: 1 Airway Equipment and Method: Stylet Placement Confirmation: ETT inserted through vocal cords under direct vision, positive ETCO2 and breath sounds checked- equal and bilateral Secured at: 23 cm Tube secured with: Tape Dental Injury: Teeth and Oropharynx as per pre-operative assessment

## 2022-03-03 NOTE — Transfer of Care (Signed)
Immediate Anesthesia Transfer of Care Note  Patient: Rose Berry  Procedure(s) Performed: ABDOMINAL MYOMECTOMY (Abdomen)  Patient Location: PACU  Anesthesia Type:General  Level of Consciousness: awake and alert   Airway & Oxygen Therapy: Patient Spontanous Breathing and Patient connected to face mask oxygen  Post-op Assessment: Report given to RN and Post -op Vital signs reviewed and stable  Post vital signs: Reviewed and stable  Last Vitals:  Vitals Value Taken Time  BP 127/75 03/03/22 0951  Temp    Pulse 65 03/03/22 0955  Resp 20 03/03/22 0955  SpO2 100 % 03/03/22 0955  Vitals shown include unvalidated device data.  Last Pain:  Vitals:   03/03/22 0630  TempSrc:   PainSc: 2          Complications: No notable events documented.

## 2022-03-03 NOTE — Anesthesia Procedure Notes (Signed)
Anesthesia Regional Block: TAP block   Pre-Anesthetic Checklist: , timeout performed,  Correct Patient, Correct Site, Correct Laterality,  Correct Procedure, Correct Position, site marked,  Risks and benefits discussed,  Surgical consent,  Pre-op evaluation,  At surgeon's request and post-op pain management  Laterality: Right  Prep: chloraprep       Needles:  Injection technique: Single-shot      Needle Length: 9cm  Needle Gauge: 22     Additional Needles: Arrow StimuQuik ECHO Echogenic Stimulating PNB Needle  Procedures:,,,, ultrasound used (permanent image in chart),,    Narrative:  Start time: 03/03/2022 7:43 AM End time: 03/03/2022 7:48 AM Injection made incrementally with aspirations every 5 mL.  Performed by: Personally  Anesthesiologist: Oleta Mouse, MD

## 2022-03-03 NOTE — H&P (Signed)
Preoperative History and Physical  Rose Berry is a 31 y.o. G0P0000 here for surgical management of uterine fibroids.   Proposed surgery: abdominal myomectomy  Past Medical History:  Diagnosis Date   Anemia    Asthma    Family history of adverse reaction to anesthesia    grandmother N/V   UTI (urinary tract infection)    Past Surgical History:  Procedure Laterality Date   mouth procedure     pt reports having her mouth worked on years ago, unsure of what   OB History     Gravida  0   Para  0   Term  0   Preterm  0   AB  0   Living  0      SAB  0   IAB  0   Ectopic  0   Multiple  0   Live Births  0          Patient denies any cervical dysplasia or STIs. Medications Prior to Admission  Medication Sig Dispense Refill Last Dose   acetaminophen (TYLENOL) 500 MG tablet Take 1,000 mg by mouth daily as needed for moderate pain or headache.   Past Week   butalbital-apap-caffeine-codeine (FIORICET WITH CODEINE) 50-325-40-30 MG capsule Take 1 capsule by mouth every 6 (six) hours as needed for headache. 8 capsule 0 Past Month   FEROSUL 325 (65 Fe) MG tablet TAKE 1 TABLET BY MOUTH THREE TIMES DAILY (Patient taking differently: Take 325 mg by mouth 3 (three) times daily with meals.) 90 tablet 0 03/02/2022   Multiple Vitamins-Minerals (MULTIVITAMIN GUMMIES WOMENS) CHEW Chew 2 each by mouth daily.   Past Week   naproxen sodium (ALEVE) 220 MG tablet Take 440-660 mg by mouth 2 (two) times daily as needed (cramps).   Past Week    No Known Allergies Social History:   reports that she has never smoked. She has never used smokeless tobacco. She reports that she does not drink alcohol and does not use drugs. Family History  Problem Relation Age of Onset   Arthritis Mother    Hypertension Mother    Hyperlipidemia Maternal Grandmother    Hypertension Maternal Grandmother    Diabetes Maternal Grandmother    Kidney disease Maternal Grandmother    Hyperlipidemia Maternal  Grandfather    Hypertension Maternal Grandfather    Diabetes Maternal Grandfather    Kidney disease Maternal Grandfather    Hyperlipidemia Paternal Grandmother    Hypertension Paternal Grandmother    Diabetes Paternal Grandmother    Kidney disease Paternal Grandmother    Hyperlipidemia Paternal Grandfather    Hypertension Paternal Grandfather    Diabetes Paternal Grandfather    Kidney disease Paternal Grandfather     Review of Systems: Noncontributory  PHYSICAL EXAM: Blood pressure 137/86, pulse 87, temperature 98.2 F (36.8 C), temperature source Oral, resp. rate 18, height '5\' 2"'$  (1.575 m), weight 78.9 kg, last menstrual period 02/20/2022, SpO2 100 %. General appearance - alert, well appearing, and in no distress Chest - clear to auscultation, no wheezes, rales or rhonchi, symmetric air entry Heart - normal rate and regular rhythm Abdomen - soft, nontender, nondistended, no masses or organomegaly Pelvic - examination not indicated Extremities - peripheral pulses normal, no pedal edema, no clubbing or cyanosis  Labs: Results for orders placed or performed during the hospital encounter of 03/03/22 (from the past 336 hour(s))  ABO/Rh   Collection Time: 03/03/22  6:00 AM  Result Value Ref Range   ABO/RH(D)  B POS Performed at [facility], [place] [address], [place], [place] [address]   CBC   Collection Time: 03/03/22  6:19 AM  Result Value Ref Range   WBC 6.0 4.0 - 10.5 K/uL   RBC 4.72 3.87 - 5.11 MIL/uL   Hemoglobin 11.6 (L) 12.0 - 15.0 g/dL   HCT 36.7 36.0 - 46.0 %   MCV 77.8 (L) 80.0 - 100.0 fL   MCH 24.6 (L) 26.0 - 34.0 pg   MCHC 31.6 30.0 - 36.0 g/dL   RDW 26.0 (H) 11.5 - 15.5 %   Platelets 289 150 - 400 K/uL   nRBC 0.0 0.0 - 0.2 %  Results for orders placed or performed during the hospital encounter of 02/23/22 (from the past 336 hour(s))  CBC per protocol   Collection Time: 02/23/22  2:10 PM  Result Value Ref Range   WBC 5.5 4.0 - 10.5 K/uL   RBC 4.78  3.87 - 5.11 MIL/uL   Hemoglobin 11.4 (L) 12.0 - 15.0 g/dL   HCT 38.1 36.0 - 46.0 %   MCV 79.7 (L) 80.0 - 100.0 fL   MCH 23.8 (L) 26.0 - 34.0 pg   MCHC 29.9 (L) 30.0 - 36.0 g/dL   RDW Not Measured 11.5 - 15.5 %   Platelets 229 150 - 400 K/uL   nRBC 0.0 0.0 - 0.2 %  Type and screen   Collection Time: 02/23/22  2:23 PM  Result Value Ref Range   ABO/RH(D) B POS    Antibody Screen NEG    Sample Expiration 03/09/2022,2359    Extend sample reason      NO TRANSFUSIONS OR PREGNANCY IN THE PAST 3 MONTHS Performed at [facility], [address] [place],  [address]     Imaging Studies: CLINICAL DATA:  Menorrhagia, pelvic pain and heavy bleeding for 2 months, history of fibroids   EXAM: TRANSABDOMINAL AND TRANSVAGINAL ULTRASOUND OF PELVIS   TECHNIQUE: Both transabdominal and transvaginal ultrasound examinations of the pelvis were performed. Transabdominal technique was performed for global imaging of the pelvis including uterus, ovaries, adnexal regions, and pelvic cul-de-sac. It was necessary to proceed with endovaginal exam following the transabdominal exam to visualize the endometrium.   COMPARISON:  10/18/2018   FINDINGS: Uterus   Measurements: 12.5 x 8.9 x 7.8 cm = volume: 451 mL. Appears enlarged and heterogeneous. Large anterior upper uterine mass 6.2 x 4.9 x 5.7 cm, intramural extending submucosal. Additional small leiomyoma at posterior mid uterus, intramural, 2.3 x 2.2 x 2.8 cm.   Endometrium   Displaced by large leiomyoma at upper uterine segment. Measures up to 8 mm thick. No focal abnormality or endometrial fluid seen   Right ovary   Measurements: 4.2 x 2.4 x 3.7 cm = volume: 19.4. Dominant follicle versus corpus luteum. No additional masses   Left ovary   Measurements: 3.0 x 1.6 x 2.5 cm = volume: 6.1 mL. Normal morphology without mass   Other findings   Trace free pelvic fluid.  No adnexal masses otherwise seen.   IMPRESSION: 2 large  uterine leiomyomata, larger of which measures 6.2 cm in greatest size and extends submucosal at the anterior aspect of the upper uterine segment.   Smaller leiomyoma is at the posterior mid uterus, intramural, 2.8 cm greatest diameter.   No adnexal abnormalities.      Assessment: Patient Active Problem List   Diagnosis Date Noted   Fibroid 03/09/2016   Pelvic pain 03/09/2016   Right ovarian cyst  03/09/2016   Anemia 12/12/2014   Back pain 12/12/2014   UTI (urinary tract infection) 12/12/2014   Menorrhagia with regular cycle 12/12/2014    Plan: Patient will undergo surgical management with abdominal myomectomy.   The risks of surgery were discussed in detail with the patient including but not limited to: bleeding which may require transfusion or reoperation; infection which may require antibiotics; injury to surrounding organs which may involve bowel, bladder, ureters ; need for additional procedures including laparoscopy or laparotomy; thromboembolic phenomenon, surgical site problems and other postoperative/anesthesia complications. Likelihood of success in alleviating the patient's condition was discussed. Routine postoperative instructions will be reviewed with the patient and her family in detail after surgery.  The patient concurred with the proposed plan, giving informed written consent for the surgery.  Patient has been NPO since last night she will remain NPO for procedure.  Anesthesia and OR aware.  Preoperative prophylactic antibiotics and SCDs ordered on call to the OR.  To OR when ready.  Apryll Hinkle L. Ihor Dow, M.D., [facility] 03/03/2022 7:23 AM

## 2022-03-03 NOTE — Anesthesia Postprocedure Evaluation (Signed)
Anesthesia Post Note  Patient: Rose Berry  Procedure(s) Performed: ABDOMINAL MYOMECTOMY (Abdomen)     Patient location during evaluation: PACU Anesthesia Type: General and Regional Level of consciousness: awake and alert, oriented and patient cooperative Pain management: pain level controlled Vital Signs Assessment: post-procedure vital signs reviewed and stable Respiratory status: spontaneous breathing, nonlabored ventilation and respiratory function stable Cardiovascular status: blood pressure returned to baseline and stable Postop Assessment: no apparent nausea or vomiting Anesthetic complications: no   No notable events documented.  Last Vitals:  Vitals:   03/03/22 1110 03/03/22 1145  BP:  109/72  Pulse:  79  Resp:    Temp:    SpO2: 98% 99%    Last Pain:   Pain Goal:                   Pervis Hocking

## 2022-03-03 NOTE — Op Note (Signed)
03/03/2022  9:46 AM  PATIENT:  Rose Berry  31 y.o. female  PRE-OPERATIVE DIAGNOSIS:  Abdominal Uterine Bleeding Fibroids  POST-OPERATIVE DIAGNOSIS:  Abdominal Uterine Bleeding Fibroids  PROCEDURE:  Procedure(s): ABDOMINAL MYOMECTOMY (N/A)  SURGEON:  Surgeon(s) and Role:    * Lavonia Drafts, MD - Primary    * Darliss Cheney, MD - Assisting  ANESTHESIA:   local, regional, and general  EBL:  150 mL   BLOOD ADMINISTERED:none  DRAINS: none   LOCAL MEDICATIONS USED:  MARCAINE     SPECIMEN:  Source of Specimen:  uterine fibroids  DISPOSITION OF SPECIMEN:  PATHOLOGY  COUNTS:  YES  TOURNIQUET:  * No tourniquets in log *  DICTATION: .Note written in EPIC  PLAN OF CARE: Admit to inpatient   PATIENT DISPOSITION:  PACU - hemodynamically stable.   Delay start of Pharmacological VTE agent (>24hrs) due to surgical blood loss or risk of bleeding: yes  Complications: none immediate  31 y/o G0 with h/o heavy bleeding pelvic pain here for abdominal myomectomy for symptomatic uterine leiomyomata.   Findings: there was filmy adhesions of the uterus to the bowel posteriorly and to the pelvic sidewalls. There was scar tissue surrounding the fallopian tubes.   The risks of surgery were discussed with the patient including but not limited to: bleeding which may require transfusion or reoperation; infection which may require antibiotics; injury to bowel, bladder, ureters or other surrounding organs; need for additional procedures; thromboembolic phenomenon, incisional problems and other postoperative/anesthesia complications. Written informed consent was obtained.    PROCEDURE IN DETAIL:  The patient received IV preoperative antibiotics approximately 30 minutes prior to procedure.  She was then taken to the operating room and general anesthesia was administered without difficulty.  She was placed in dorsal supine position and prepped and draped in a sterile manner.  A Foley  catheter was inserted into the bladder and attached to constant drainage.  After an adequate timeout was performed, attention was then turned to the abdomen where a Pfannenstiel incision was made with a scalpel.  This incision was carried down to the fascia using electrocautery.  The fascia was incised in the midline and this incision was extended bilaterally using the Mayo scissors.  Kocher's were applied to the superior aspect of the fascial incision, and the rectus muscles were dissected off bluntly and sharply using the Mayo scissors.  A similar process was carried out at the inferior aspect of the incision.  The rectus muscles were separated in the midline bluntly and the peritoneum was identified, picked up and incised with the scalpel.  This incision was extended superiorly and inferiorly using the scalpel with good visualization of the bladder and bowel.  Attention was then turned to the uterus where multiple uterine leiomyomata were noted.  A bladder flap was created and a towel clamp was used on the uterine fundus for retraction of the uterus.  The uterus was then delivered up through the incision with the towel clamp. Attention was then turned to the uterine surface where Vasopressin solution was injected over the surface of the leiomyoma to aid with hemostasis.  A transverse incision was made over the uterine leiomyoma and the capsule was recognized.  Using blunt and sharp methods, the leiomyoma was freed from the surrounding myometrial tissue and removed intact.  Other leiomyomata were removed in similar fashion through this same incision. The largest fibroid extended into the endometrium.  After removal of all the leiomyomata which were al removed from the anterior aspect  of the uterus, the incision was closed in layers. The endometrium was initially closed with 3-0 vicryl and the myometrium was then closed with 0 Vicryl in a running locked fashion.  The deeper layers were also reapproximated using 0  Vicryl running stitches, and the serosa was reapproximated using 0 Vicryl imbricating stitch.  Overall good hemostasis was noted.  There was a tear in the serosa on the left side and this was closed with a running locked suture of 0 vicryl. The uterus was returned to the abdomen.  The laparotomy sponges were then removed from the abdomen.  The pelvic was irrigated with warmed normal saline. Arista was placed over the incisions. At this time, the muscle and peritoneum were re approximated using 1 figure of eight suture of 0 vicryl.  The fascia was reapproximated with 0 Vicryl running stitch and the subcutaneous layer was reapproximated with 3-0 vicryl in a running suture.  The skin was closed with 3-0 Vicryl subcuticular stitch using a Lanny Hurst needle.  The patient tolerated the procedure well. There were no complications during this case.  Sponge, lap, needle and instrument counts were correct x2.  The patient was taken to the recovery room extubated and in stable condition.   An experienced assistant was required given the standard of surgical care given the complexity of the case.  This assistant was needed for exposure, dissection, suctioning, retraction, instrument exchange, and for overall help during the procedure.  Kamarah Bilotta L. Harraway-Smith, M.D., Cherlynn June

## 2022-03-03 NOTE — Brief Op Note (Signed)
03/03/2022  9:46 AM  PATIENT:  Rose Berry  31 y.o. female  PRE-OPERATIVE DIAGNOSIS:  Abdominal Uterine Bleeding Fibroids  POST-OPERATIVE DIAGNOSIS:  Abdominal Uterine Bleeding Fibroids  PROCEDURE:  Procedure(s): ABDOMINAL MYOMECTOMY (N/A)  SURGEON:  Surgeon(s) and Role:    * Lavonia Drafts, MD - Primary    * Darliss Cheney, MD - Assisting  ANESTHESIA:   local, regional, and general  EBL:  150 mL   BLOOD ADMINISTERED:none  DRAINS: none   LOCAL MEDICATIONS USED:  MARCAINE     SPECIMEN:  Source of Specimen:  uterine fibroids  DISPOSITION OF SPECIMEN:  PATHOLOGY  COUNTS:  YES  TOURNIQUET:  * No tourniquets in log *  DICTATION: .Note written in EPIC  PLAN OF CARE: Admit to inpatient   PATIENT DISPOSITION:  PACU - hemodynamically stable.   Delay start of Pharmacological VTE agent (>24hrs) due to surgical blood loss or risk of bleeding: yes  Complications: none immediate  Ramez Arrona L. Harraway-Smith, M.D., Cherlynn June

## 2022-03-04 ENCOUNTER — Encounter (HOSPITAL_COMMUNITY): Payer: Self-pay | Admitting: Obstetrics & Gynecology

## 2022-03-04 LAB — CBC
HCT: 24.7 % — ABNORMAL LOW (ref 36.0–46.0)
HCT: 22.3 % — ABNORMAL LOW (ref 36.0–46.0)
Hemoglobin: 7.7 g/dL — ABNORMAL LOW (ref 12.0–15.0)
Hemoglobin: 7.1 g/dL — ABNORMAL LOW (ref 12.0–15.0)
MCH: 24.6 pg — ABNORMAL LOW (ref 26.0–34.0)
MCH: 25.1 pg — ABNORMAL LOW (ref 26.0–34.0)
MCHC: 31.2 g/dL (ref 30.0–36.0)
MCHC: 31.8 g/dL (ref 30.0–36.0)
MCV: 78.9 fL — ABNORMAL LOW (ref 80.0–100.0)
MCV: 78.8 fL — ABNORMAL LOW (ref 80.0–100.0)
Platelets: 229 10*3/uL (ref 150–400)
Platelets: 190 10*3/uL (ref 150–400)
RBC: 3.13 MIL/uL — ABNORMAL LOW (ref 3.87–5.11)
RBC: 2.83 MIL/uL — ABNORMAL LOW (ref 3.87–5.11)
RDW: 26.2 % — ABNORMAL HIGH (ref 11.5–15.5)
RDW: 26.2 % — ABNORMAL HIGH (ref 11.5–15.5)
WBC: 8.4 10*3/uL (ref 4.0–10.5)
WBC: 7.4 10*3/uL (ref 4.0–10.5)
nRBC: 0 % (ref 0.0–0.2)
nRBC: 0 % (ref 0.0–0.2)

## 2022-03-04 MED ORDER — SODIUM CHLORIDE 0.9 % IV SOLN
300.0000 mg | Freq: Once | INTRAVENOUS | Status: DC
  Filled 2022-03-04: qty 15

## 2022-03-04 MED ORDER — SODIUM CHLORIDE 0.9 % IV SOLN
500.0000 mg | Freq: Once | INTRAVENOUS | Status: AC
  Administered 2022-03-04: 500 mg via INTRAVENOUS
  Filled 2022-03-04: qty 500

## 2022-03-04 NOTE — Plan of Care (Signed)

## 2022-03-04 NOTE — Progress Notes (Signed)
1 Day Post-Op Procedure(s) (LRB): ABDOMINAL MYOMECTOMY (N/A)  Subjective: Patient reports incisional pain, tolerating PO, and no problems voiding.  Pt had nausea last night that has resolved.  Has walked to the BR.   Objective: I have reviewed patient's vital signs, intake and output, and labs. BP 130/67 (BP Location: Right Arm)   Pulse 84   Temp 97.8 F (36.6 C) (Oral)   Resp 16   Ht '5\' 2"'$  (1.575 m)   Wt 78.9 kg   LMP 02/20/2022 (Exact Date)   SpO2 100%   BMI 31.83 kg/m   General: alert and no distress Resp: clear to auscultation bilaterally Cardio: regular rate and rhythm, S1, S2 normal, no murmur, click, rub or gallop GI: soft, non-tender; bowel sounds normal; no masses,  no organomegaly and incision: clean, dry, and intact Extremities: extremities normal, atraumatic, no cyanosis or edema Vaginal Bleeding: none Performs incentive spirometry well  Assessment: s/p Procedure(s): ABDOMINAL MYOMECTOMY (N/A): stable, progressing well, tolerating diet, and post op anemia  Plan: Advance diet Encourage ambulation Advance to PO medication Discontinue IV fluids Ambulate in halls  Repeat CBC.  Iron transfusion if CBC stable but, remains low.  Possible discharge later today if meets criteria.     LOS: 1 day    Lavonia Drafts, MD 03/04/2022, 9:05 AM

## 2022-03-05 ENCOUNTER — Encounter: Payer: Self-pay | Admitting: Obstetrics & Gynecology

## 2022-03-05 ENCOUNTER — Encounter: Payer: Self-pay | Admitting: General Practice

## 2022-03-05 ENCOUNTER — Encounter (HOSPITAL_COMMUNITY): Payer: Self-pay | Admitting: Obstetrics & Gynecology

## 2022-03-05 LAB — CBC
HCT: 21.2 % — ABNORMAL LOW (ref 36.0–46.0)
Hemoglobin: 6.7 g/dL — CL (ref 12.0–15.0)
MCH: 25.4 pg — ABNORMAL LOW (ref 26.0–34.0)
MCHC: 31.6 g/dL (ref 30.0–36.0)
MCV: 80.3 fL (ref 80.0–100.0)
Platelets: 152 K/uL (ref 150–400)
RBC: 2.64 MIL/uL — ABNORMAL LOW (ref 3.87–5.11)
RDW: 25.5 % — ABNORMAL HIGH (ref 11.5–15.5)
WBC: 5.9 K/uL (ref 4.0–10.5)
nRBC: 0 % (ref 0.0–0.2)

## 2022-03-05 LAB — SURGICAL PATHOLOGY

## 2022-03-05 MED ORDER — OXYCODONE-ACETAMINOPHEN 5-325 MG PO TABS: 1.0000 | ORAL_TABLET | ORAL | 0 refills | Status: DC | PRN

## 2022-03-05 MED ORDER — IBUPROFEN 600 MG PO TABS: 600.0000 mg | ORAL_TABLET | Freq: Four times a day (QID) | ORAL | 0 refills | Status: DC

## 2022-03-05 MED ORDER — INTEGRA F 125-1 MG PO CAPS: 1.0000 | ORAL_CAPSULE | Freq: Every day | ORAL | 3 refills | Status: DC

## 2022-03-05 MED ORDER — DOCUSATE SODIUM 100 MG PO CAPS: 100.0000 mg | ORAL_CAPSULE | Freq: Two times a day (BID) | ORAL | 0 refills | Status: DC

## 2022-03-05 NOTE — Plan of Care (Signed)
  Problem: Education: Goal: Knowledge of the prescribed therapeutic regimen will improve Outcome: Completed/Met Goal: Understanding of sexual limitations or changes related to disease process or condition will improve Outcome: Completed/Met Goal: Individualized Educational Video(s) Outcome: Completed/Met   Problem: Self-Concept: Goal: Communication of feelings regarding changes in body function or appearance will improve Outcome: Completed/Met   Problem: Skin Integrity: Goal: Demonstration of wound healing without infection will improve Outcome: Completed/Met   Problem: Education: Goal: Knowledge of General Education information will improve Description: Including pain rating scale, medication(s)/side effects and non-pharmacologic comfort measures Outcome: Completed/Met   Problem: Health Behavior/Discharge Planning: Goal: Ability to manage health-related needs will improve Outcome: Completed/Met   Problem: Clinical Measurements: Goal: Ability to maintain clinical measurements within normal limits will improve Outcome: Completed/Met Goal: Will remain free from infection Outcome: Completed/Met Goal: Diagnostic test results will improve Outcome: Completed/Met Goal: Respiratory complications will improve Outcome: Completed/Met Goal: Cardiovascular complication will be avoided Outcome: Completed/Met   Problem: Activity: Goal: Risk for activity intolerance will decrease Outcome: Completed/Met   Problem: Nutrition: Goal: Adequate nutrition will be maintained Outcome: Completed/Met   Problem: Coping: Goal: Level of anxiety will decrease Outcome: Completed/Met   Problem: Elimination: Goal: Will not experience complications related to bowel motility Outcome: Completed/Met Goal: Will not experience complications related to urinary retention Outcome: Completed/Met   Problem: Pain Managment: Goal: General experience of comfort will improve Outcome: Completed/Met    Problem: Safety: Goal: Ability to remain free from injury will improve Outcome: Completed/Met   Problem: Skin Integrity: Goal: Risk for impaired skin integrity will decrease Outcome: Completed/Met   

## 2022-03-05 NOTE — Discharge Summary (Signed)
Physician Discharge Summary  Patient ID: Rose Berry MRN: [id number] DOB/AGE: Jun 18, 1990 31 y.o.  Admit date: 03/03/2022 Discharge date: 03/05/2022  Admission Diagnoses:  AUB, pelvic pain and fibroids  Discharge Diagnoses:  Principal Problem:   Postoperative state  Discharged Condition: good  Hospital Course: Patient had an uncomplicated surgery; for further details of this surgery, please refer to the operative note. The pts post op course was complicated by asymptomatic post op acute on chronic anemia. Pt received IV iron on POD #2. She denies dizziness or SOB and was ambulating without difficulty or sx. By time of discharge, her pain was controlled on oral pain medications; she was ambulating, voiding without difficulty, tolerating regular diet and passing flatus.  She was deemed stable for discharge to home.     Consults: None  Significant Diagnostic Studies: labs: CBC, Iron   Treatments: surgery: Abdominal myomectomy   Discharge Exam: Blood pressure (!) 111/52, pulse 84, temperature 98.5 F (36.9 C), temperature source Oral, resp. rate 18, height '5\' 2"'$  (1.575 m), weight 78.9 kg, last menstrual period 02/20/2022, SpO2 97 %. I/O last 3 completed shifts: In: 275 [IV Piggyback:275] Out: 875 [Urine:875] No intake/output data recorded.   General appearance: alert and no distress Resp: clear to auscultation bilaterally Cardio: regular rate and rhythm, S1, S2 normal, no murmur, click, rub or gallop GI: soft, non-tender; bowel sounds normal; no masses,  no organomegaly and dressing clean and dry  Extremities: extremities normal, atraumatic, no cyanosis or edema     Latest Ref Rng & Units 03/05/2022    5:53 AM 03/04/2022   12:34 PM 03/04/2022    4:51 AM  CBC  WBC 4.0 - 10.5 K/uL 5.9  7.4  8.4   Hemoglobin 12.0 - 15.0 g/dL 6.7  7.1  7.7   Hematocrit 36.0 - 46.0 % 21.2  22.3  24.7   Platelets 150 - 400 K/uL 152  190  229     Disposition: Discharge disposition: 01-Home or  Self Care       Discharge Instructions     Call MD for:  difficulty breathing, headache or visual disturbances   Complete by: As directed    Call MD for:  extreme fatigue   Complete by: As directed    Call MD for:  hives   Complete by: As directed    Call MD for:  persistant dizziness or light-headedness   Complete by: As directed    Call MD for:  persistant nausea and vomiting   Complete by: As directed    Call MD for:  redness, tenderness, or signs of infection (pain, swelling, redness, odor or green/yellow discharge around incision site)   Complete by: As directed    Call MD for:  severe uncontrolled pain   Complete by: As directed    Call MD for:  temperature >100.4   Complete by: As directed    Diet general   Complete by: As directed    Discharge wound care:   Complete by: As directed    Keep dressing clean and dry.  Remove OUTER white dressing in 1 week. Do not remove steristrips, as they fall off, they can be removed.   Driving Restrictions   Complete by: As directed    No driving for 2 weeks   Increase activity slowly   Complete by: As directed    Lifting restrictions   Complete by: As directed    No heavy lifting for 6 weeks   Sexual Activity Restrictions   Complete  by: As directed    Nothing in vagina for 6 weeks      Allergies as of 03/05/2022   No Known Allergies      Medication List     STOP taking these medications    acetaminophen 500 MG tablet Commonly known as: TYLENOL   FeroSul 325 (65 FE) MG tablet Generic drug: ferrous sulfate   Multivitamin Gummies Womens Chew   naproxen sodium 220 MG tablet Commonly known as: ALEVE       TAKE these medications    butalbital-apap-caffeine-codeine 50-325-40-30 MG capsule Commonly known as: FIORICET WITH CODEINE Take 1 capsule by mouth every 6 (six) hours as needed for headache.   docusate sodium 100 MG capsule Commonly known as: COLACE Take 1 capsule (100 mg total) by mouth 2 (two) times  daily.   ibuprofen 600 MG tablet Commonly known as: ADVIL Take 1 tablet (600 mg total) by mouth every 6 (six) hours.   Integra F 125-1 MG Caps Take 1 capsule by mouth daily at 8 pm.               Discharge Care Instructions  (From admission, onward)           Start     Ordered   03/05/22 0000  Discharge wound care:       Comments: Keep dressing clean and dry.  Remove OUTER white dressing in 1 week. Do not remove steristrips, as they fall off, they can be removed.   03/05/22 [facility] [place] Follow up in 2 week(s).   Specialty: Obstetrics and Gynecology Contact information: [address] [place] [address] [phone]                Signed: Lavonia Drafts 03/05/2022, 9:52 AM

## 2022-03-05 NOTE — Progress Notes (Signed)
Called Dr Rip Harbour about critical hgb 6.7, he stated to call Dr Ihor Dow.  Attempted to call x 2, unable to get in touch.

## 2022-03-09 ENCOUNTER — Telehealth: Payer: Self-pay

## 2022-03-09 NOTE — Telephone Encounter (Signed)
Called patient. Patient states she is feeling better and she is using a cool compress on her abdomen. Advised patient to call if she starts to notice any discolored drainage or if she develops a fever. Understanding was voiced. Zaquan Duffner l Jheri Mitter, CMA

## 2022-03-09 NOTE — Telephone Encounter (Signed)
-----   Message from Maurine Minister, [place] sent at 03/09/2022  9:40 AM EST ----- Regarding: Request Call Back Patient had surgery and c/o being hot, but does not have a temperature per her mom.

## 2022-03-11 DIAGNOSIS — E669 Obesity, unspecified: Secondary | ICD-10-CM | POA: Insufficient documentation

## 2022-03-13 ENCOUNTER — Other Ambulatory Visit: Payer: Self-pay

## 2022-03-13 ENCOUNTER — Other Ambulatory Visit (HOSPITAL_BASED_OUTPATIENT_CLINIC_OR_DEPARTMENT_OTHER): Payer: Self-pay

## 2022-03-13 MED ORDER — INTEGRA F 125-1 MG PO CAPS
1.0000 | ORAL_CAPSULE | Freq: Every day | ORAL | 3 refills | Status: DC
  Filled 2022-03-13: qty 30, 30d supply, fill #0

## 2022-03-13 NOTE — Progress Notes (Signed)
Rose Berry sent to [facility] pharmacy.[name] RN

## 2022-03-16 ENCOUNTER — Other Ambulatory Visit: Payer: Self-pay | Admitting: Obstetrics & Gynecology

## 2022-03-16 ENCOUNTER — Other Ambulatory Visit (HOSPITAL_BASED_OUTPATIENT_CLINIC_OR_DEPARTMENT_OTHER): Payer: Self-pay

## 2022-03-16 DIAGNOSIS — Z9889 Other specified postprocedural states: Secondary | ICD-10-CM

## 2022-03-18 ENCOUNTER — Encounter: Payer: Self-pay | Admitting: Obstetrics & Gynecology

## 2022-03-18 ENCOUNTER — Ambulatory Visit (INDEPENDENT_AMBULATORY_CARE_PROVIDER_SITE_OTHER): Payer: 59 | Admitting: Obstetrics & Gynecology

## 2022-03-18 VITALS — BP 113/73 | HR 85 | Wt 176.0 lb

## 2022-03-18 DIAGNOSIS — Z9889 Other specified postprocedural states: Secondary | ICD-10-CM

## 2022-03-18 NOTE — Progress Notes (Signed)
History:  31 y.o.LMP here today for 2 week post op check.Pt is s/p myomectomy on 03/03/2022.  Pt reports that she is doing well. She is eating and passing stools without difficulty.  She has a challenging day on yesterday as her activities are limited and she finds that frustrating. She took motrin yesterday but, is off all narcs.    The following portions of the patient's history were reviewed and updated as appropriate: allergies, current medications, past family history, past medical history, past social history, past surgical history and problem list.  Review of Systems:  Pertinent items are noted in HPI.    Objective:  Physical Exam BP 113/73   Pulse 85   Wt 176 lb (79.8 kg)   LMP 02/20/2022 (Exact Date)   BMI 32.19 kg/m   CONSTITUTIONAL: Well-developed, well-nourished female in no acute distress.  HENT:  Normocephalic, atraumatic EYES: Conjunctivae and EOM are normal. No scleral icterus.  NECK: Normal range of motion SKIN: Skin is warm and dry. No rash noted. Not diaphoretic.No pallor. [place]: Alert and oriented to person, place, and time. Normal coordination.  Abd: Soft, nontender and nondistended; her steristrips were removed; her transverse incision is healing well. .  Pelvic: deferred  Labs and Imaging Surg path 03/03/2022 FINAL MICROSCOPIC DIAGNOSIS:   A. UTERINE FIBROIDS, MYOMECTOMY:  -  Heavily calcified and hyalinized whorled smooth muscle with evidence  of ischemia, consistent with benign leiomyomata.    Assessment & Plan:  2 week post op check following myomectomy  Doing well  Reviewed her surg path.   Reviewed post op instructions and activities  Gradual increase in activities  F/u in 4 weeks or sooner prn  Reviewed no intercourse for 8 weeks post op  All questions answered.   Silver Parkey L. Harraway-Smith, M.D., Cherlynn June

## 2022-03-23 ENCOUNTER — Encounter: Payer: Self-pay | Admitting: Obstetrics & Gynecology

## 2022-03-26 ENCOUNTER — Encounter: Payer: Self-pay | Admitting: General Practice

## 2022-03-27 ENCOUNTER — Other Ambulatory Visit (HOSPITAL_BASED_OUTPATIENT_CLINIC_OR_DEPARTMENT_OTHER): Payer: Self-pay

## 2022-04-06 ENCOUNTER — Encounter: Payer: Self-pay | Admitting: Obstetrics & Gynecology

## 2022-04-08 ENCOUNTER — Encounter: Payer: 59 | Admitting: Obstetrics & Gynecology

## 2022-04-15 ENCOUNTER — Ambulatory Visit (INDEPENDENT_AMBULATORY_CARE_PROVIDER_SITE_OTHER): Payer: 59 | Admitting: Obstetrics & Gynecology

## 2022-04-15 ENCOUNTER — Encounter: Payer: Self-pay | Admitting: Obstetrics & Gynecology

## 2022-04-15 VITALS — BP 127/80 | HR 102 | Wt 175.0 lb

## 2022-04-15 DIAGNOSIS — Z9889 Other specified postprocedural states: Secondary | ICD-10-CM

## 2022-04-15 NOTE — Progress Notes (Signed)
History:  31 y.o.LMP here today for 2 week post op check.Pt is s/p abdominal myomectomy on 03/03/2022.  Pt reports that she is doing well. She is eating and passing stools without difficulty.  'little to no pain'.  Pt is scheduled to RTW on tomorrow.  She has had about 4 days of spotting. After thinking about it, this is consistent with the time period for her menses.    The following portions of the patient's history were reviewed and updated as appropriate: allergies, current medications, past family history, past medical history, past social history, past surgical history and problem list.  Review of Systems:  Pertinent items are noted in HPI.    Objective:  Physical Exam BP 127/80   Pulse (!) 102   Wt 175 lb (79.4 kg)   BMI 32.01 kg/m   CONSTITUTIONAL: Well-developed, well-nourished female in no acute distress.  HENT:  Normocephalic, atraumatic EYES: Conjunctivae and EOM are normal. No scleral icterus.  NECK: Normal range of motion SKIN: Skin is warm and dry. No rash noted. Not diaphoretic.No pallor. [place]: Alert and oriented to person, place, and time. Normal coordination.  Abd: Soft, nontender and nondistended. Incision healing well. Pelvic: deferred  Labs and Imaging Surg path 03/03/2022 FINAL MICROSCOPIC DIAGNOSIS:   A. UTERINE FIBROIDS, MYOMECTOMY:  -  Heavily calcified and hyalinized whorled smooth muscle with evidence  of ischemia, consistent with benign leiomyomata.    Assessment & Plan:  6 week post op check following abdominal myomectomy on 03/03/2022  Doing well  Reviewed post op instructions and activities  Gradual increase in activities to full activity.   F/u in 3 months as needed or sooner prn  All questions answered.   Sherrod Toothman L. Harraway-Smith, M.D., Cherlynn June

## 2022-04-15 NOTE — Progress Notes (Signed)
Patient reports having "just spotting" since surgery.

## 2022-05-07 ENCOUNTER — Encounter: Payer: Self-pay | Admitting: Obstetrics & Gynecology

## 2022-05-13 ENCOUNTER — Telehealth: Payer: Self-pay

## 2022-05-13 NOTE — Telephone Encounter (Signed)
Attempted to reach patient in regards to her my chart message sent to Dr. Ihor Dow on Jan 11th.   If patient is still having pain then Dr. Ihor Dow will see her. [name] RN

## 2022-05-20 ENCOUNTER — Encounter: Payer: Self-pay | Admitting: Obstetrics & Gynecology

## 2022-05-20 ENCOUNTER — Ambulatory Visit (INDEPENDENT_AMBULATORY_CARE_PROVIDER_SITE_OTHER): Payer: 59 | Admitting: Obstetrics & Gynecology

## 2022-05-20 VITALS — BP 126/84 | HR 79 | Wt 176.0 lb

## 2022-05-20 DIAGNOSIS — N946 Dysmenorrhea, unspecified: Secondary | ICD-10-CM

## 2022-05-20 MED ORDER — DICLOFENAC SODIUM 75 MG PO TBEC: 75.0000 mg | DELAYED_RELEASE_TABLET | Freq: Three times a day (TID) | ORAL | 1 refills | Status: DC

## 2022-05-20 MED ORDER — TRAMADOL HCL 50 MG PO TABS: 50.0000 mg | ORAL_TABLET | Freq: Four times a day (QID) | ORAL | 0 refills | Status: DC | PRN

## 2022-05-20 NOTE — Progress Notes (Signed)
Patient wanting to discuss FMLA for painful periods. [name] RN

## 2022-05-20 NOTE — Progress Notes (Signed)
History:  32 y.o. G0P0000 here today for dysmenorrhea following a myomectomy 2 months ago. Her first cycle post surgery was very ligth with no pain. Her cycle last week was assoc with severe pain limiting her ability to work.  Her bleeding was not heavy. The pain resolved in the 3 days of her cycle. Not other assoc sx. She has no pain currently.   The following portions of the patient's history were reviewed and updated as appropriate: allergies, current medications, past family history, past medical history, past social history, past surgical history and problem list.  Review of Systems:  Pertinent items are noted in HPI.    Objective:  Physical Exam Blood pressure 126/84, pulse 79, weight 176 lb (79.8 kg), last menstrual period 05/06/2022.  CONSTITUTIONAL: Well-developed, well-nourished female in no acute distress.  HENT:  Normocephalic, atraumatic EYES: Conjunctivae and EOM are normal. No scleral icterus.  NECK: Normal range of motion SKIN: Skin is warm and dry. No rash noted. Not diaphoretic.No pallor. [place]: Alert and oriented to person, place, and time. Normal coordination.  Abd: Soft, nontender and non distended; uterus palpable in low pelvis. Non tender Pelvic: not done   Assessment & Plan:  Rose Berry was seen today for follow-up.  Diagnoses and all orders for this visit:  Dysmenorrhea -     diclofenac (VOLTAREN) 75 MG EC tablet; Take 1 tablet (75 mg total) by mouth 3 (three) times daily. -     traMADol (ULTRAM) 50 MG tablet; Take 1 tablet (50 mg total) by mouth every 6 (six) hours as needed for severe pain.   Pt will keep a log of sx and f/u in 3 months or sooner prn   Rose Berry L. Harraway-Smith, M.D., Cherlynn June

## 2022-09-30 ENCOUNTER — Encounter: Payer: Self-pay | Admitting: Obstetrics & Gynecology

## 2022-10-05 ENCOUNTER — Telehealth: Payer: Self-pay

## 2022-10-05 NOTE — Telephone Encounter (Signed)
Called patient to see what her symptoms are and sent them to the clinical team.

## 2022-11-04 ENCOUNTER — Ambulatory Visit (INDEPENDENT_AMBULATORY_CARE_PROVIDER_SITE_OTHER): Payer: 59 | Admitting: Obstetrics & Gynecology

## 2022-11-04 ENCOUNTER — Encounter: Payer: Self-pay | Admitting: Obstetrics & Gynecology

## 2022-11-04 VITALS — BP 124/82 | HR 97 | Wt 180.0 lb

## 2022-11-04 DIAGNOSIS — N920 Excessive and frequent menstruation with regular cycle: Secondary | ICD-10-CM

## 2022-11-04 DIAGNOSIS — L910 Hypertrophic scar: Secondary | ICD-10-CM

## 2022-11-04 NOTE — Progress Notes (Signed)
History:  32 y.o. G0P0000 here today for post surgery questions and concerns. Episode of  heavy bleeding with clots x 1 day on past prev cycle. Last cycle not as heavy. Pt also c/o increased generailized sweating since surgery. Not sure if its related to surgery. Lastly, pt reports itching of incision.    The following portions of the patient's history were reviewed and updated as appropriate: allergies, current medications, past family history, past medical history, past social history, past surgical history and problem list.  Review of Systems:  Pertinent items are noted in HPI.    Objective:  Physical Exam BP 124/82   Pulse 97   Wt 180 lb (81.6 kg)   LMP 10/23/2022   BMI 32.92 kg/m   CONSTITUTIONAL: Well-developed, well-nourished female in no acute distress.  HENT:  Normocephalic, atraumatic EYES: Conjunctivae and EOM are normal. No scleral icterus.  NECK: Normal range of motion SKIN: Skin is warm and dry. No rash noted. Not diaphoretic.No pallor. [place]UR[place]: Alert and oriented to person, place, and time. Normal coordination.  Abd: Soft, nontender and nondistended. Scar has well formed keloid.  Pelvic: Normal appearing external genitalia; normal appearing vaginal mucosa and cervix.  Normal discharge.  Small uterus, no other palpable masses, no uterine or adnexal tenderness.     Assessment & Plan:  Diagnoses and all orders for this visit:  Scarring, keloid  Menorrhagia with regular cycle   Pt will cont to monitor bleeding F/u for steroid injection of keloid.   Total face-to-face time with patient, review of chart, discussion with consultant and coordination of care was .    Winfrey Chillemi L. Harraway-Smith, M.D., [place]n Core

## 2022-11-04 NOTE — Progress Notes (Signed)
Some concerns since her surgery (abdominal myomectomy- Nov 2023).

## 2022-11-18 ENCOUNTER — Ambulatory Visit: Payer: 59 | Admitting: Obstetrics & Gynecology

## 2022-11-18 ENCOUNTER — Encounter: Payer: Self-pay | Admitting: Obstetrics & Gynecology

## 2022-11-18 VITALS — BP 123/77 | HR 88 | Ht 62.0 in | Wt 183.0 lb

## 2022-11-18 DIAGNOSIS — L910 Hypertrophic scar: Secondary | ICD-10-CM

## 2022-11-18 NOTE — Progress Notes (Signed)
Patient present for treatment of keloid scars. Armandina Stammer RN

## 2022-11-18 NOTE — Progress Notes (Signed)
Pt seen for keloid on low abd.   Area cleaned with alcohol.  1cc of Kenalog 40 + 1 cc Lidocaine 2% was injected into the keloid.  There were no complications. Pt instructed about s/sx of infection.  She tolerated the procedure well.  She will f/u in 2 weeks for repeat injection.  Cozy Veale L. Harraway-Smith, M.D., [place]n Core

## 2022-12-02 ENCOUNTER — Ambulatory Visit: Payer: 59 | Admitting: Obstetrics & Gynecology

## 2022-12-07 ENCOUNTER — Encounter: Payer: Self-pay | Admitting: Obstetrics & Gynecology

## 2022-12-29 ENCOUNTER — Ambulatory Visit: Payer: 59 | Admitting: Medical

## 2022-12-30 ENCOUNTER — Ambulatory Visit: Payer: 59 | Admitting: Medical

## 2023-01-05 ENCOUNTER — Ambulatory Visit: Payer: 59 | Admitting: Medical

## 2023-01-20 ENCOUNTER — Ambulatory Visit: Payer: 59 | Admitting: Medical

## 2023-01-20 ENCOUNTER — Encounter: Payer: Self-pay | Admitting: Obstetrics & Gynecology

## 2023-01-20 ENCOUNTER — Ambulatory Visit: Payer: 59 | Admitting: Obstetrics & Gynecology

## 2023-01-20 VITALS — BP 118/88 | HR 76 | Temp 98.1°F | Resp 16 | Ht 62.0 in | Wt 182.6 lb

## 2023-01-20 VITALS — BP 109/77 | HR 74 | Wt 182.0 lb

## 2023-01-20 DIAGNOSIS — D239 Other benign neoplasm of skin, unspecified: Secondary | ICD-10-CM | POA: Diagnosis not present

## 2023-01-20 DIAGNOSIS — L910 Hypertrophic scar: Secondary | ICD-10-CM

## 2023-01-20 DIAGNOSIS — N63 Unspecified lump in unspecified breast: Secondary | ICD-10-CM

## 2023-01-20 DIAGNOSIS — L989 Disorder of the skin and subcutaneous tissue, unspecified: Secondary | ICD-10-CM | POA: Diagnosis not present

## 2023-01-20 NOTE — Progress Notes (Signed)
Subjective:    Patient ID: Rose Berry, female    DOB: [birth year], 32 y.o.   MRN: [id number]  HPI Pt states small lump on rt side breast/axillary junction. She found it randomly in the shower. Mild painful when first found on 12-24-2022. Pt states no treatment. She states faint discomfort lasted a week. Mom mother had breast ca. Mom and aunts no hx of breast cancer. No nipple discharge. No coloration to breast.    Small bumps to rt lateral calf. State has 3 bumps. One present for years. 2 other new over past year. No trauma or bites to lower ext.   Review of Systems     Objective:   Physical Exam  General Mental Status- Alert. General Appearance- Not in acute distress.   Skin Lateral aspect of calf right side shows 3 probable dermatofibroma's.  Neck Carotid Arteries- Normal color. Moisture- Normal Moisture. No carotid bruits. No JVD.  Chest and Lung Exam Auscultation: Breath Sounds:-Normal.  Cardiovascular Auscultation:Rythm- Regular. Murmurs & Other Heart Sounds:Auscultation of the heart reveals- No Murmurs.  Breast-symmetric breast.  Normal coloration.  No nipple discharge on exam.  Right upper outer quadrant has very tiny small lump in the past about 6 to 8 mm.  No axillary lymph nodes on either side.   Neurologic Cranial Nerve exam:- CN III-XII intact(No nystagmus), symmetric smile. Drift Test:- No drift. Romberg Exam:- Negative.  Heal to Toe Gait exam:-Normal. Finger to Nose:- Normal/Intact Strength:- 5/5 equal and symmetric strength both upper and lower extremities.       Assessment & Plan:   Patient Instructions  1. Breast lump in upper outer quadrant -Very tiny small lump right upper outer quadrant.  Otherwise normal breast bilaterally.  Will go ahead and placed diagnostic bilateral mammogram.  If you do not get a call within a week to get scheduled please let me know. - MM 3D DIAGNOSTIC MAMMOGRAM BILATERAL BREAST; Future  2. Dermatofibroma and   Skin lesion -On inspection the areas in your right lower extremity appear to be probable dermatofibroma.  This is a benign condition but will refer for confirmation with dermatologist.  Follow-up date to be determined after dermatology     Esperanza Richters, PA-C

## 2023-01-20 NOTE — Patient Instructions (Signed)
1. Breast lump in upper outer quadrant -Very tiny small lump right upper outer quadrant.  Otherwise normal breast bilaterally.  Will go ahead and placed diagnostic bilateral mammogram.  If you do not get a call within a week to get scheduled please let me know. - MM 3D DIAGNOSTIC MAMMOGRAM BILATERAL BREAST; Future  2. Dermatofibroma and  Skin lesion -On inspection the areas in your right lower extremity appear to be probable dermatofibroma.  This is a benign condition but will refer for confirmation with dermatologist.  Follow-up date to be determined after dermatology

## 2023-01-20 NOTE — Progress Notes (Signed)
Pt here for steroid injection of Keloid.   Area cleaned with alcohol.  1cc of Kenalog 40 + 1 cc Lidocaine 2% was injected into the keloid.  There were no complications. Pt instructed about s/sx of infection.  She tolerated the procedure well.  She will f/u in 2 weeks for repeat injection.   Edd Reppert L. Harraway-Smith, M.D., [place]

## 2023-01-21 ENCOUNTER — Other Ambulatory Visit: Payer: Self-pay | Admitting: Medical

## 2023-01-21 DIAGNOSIS — N63 Unspecified lump in unspecified breast: Secondary | ICD-10-CM

## 2023-01-28 ENCOUNTER — Ambulatory Visit
Admission: RE | Admit: 2023-01-28 | Discharge: 2023-01-28 | Disposition: A | Discharge status: Home / Self Care | Payer: 59 | Source: Ambulatory Visit | Attending: Medical | Admitting: Medical

## 2023-01-28 DIAGNOSIS — N63 Unspecified lump in unspecified breast: Secondary | ICD-10-CM

## 2023-02-03 ENCOUNTER — Ambulatory Visit: Payer: 59 | Admitting: Obstetrics & Gynecology

## 2023-02-17 ENCOUNTER — Encounter: Payer: Self-pay | Admitting: Obstetrics & Gynecology

## 2023-02-17 ENCOUNTER — Ambulatory Visit: Payer: 59 | Admitting: Obstetrics & Gynecology

## 2023-02-17 VITALS — BP 114/64 | HR 80 | Ht 62.0 in | Wt 185.0 lb

## 2023-02-17 DIAGNOSIS — L910 Hypertrophic scar: Secondary | ICD-10-CM | POA: Diagnosis not present

## 2023-02-17 NOTE — Progress Notes (Signed)
History:  32 y.o. G0P0000 here today for steroid injection of Keloid. She reports that she still has itching occ. Improved from prev. She reports that the keloid is smaller.   The following portions of the patient's history were reviewed and updated as appropriate: allergies, current medications, past family history, past medical history, past social history, past surgical history and problem list.  Review of Systems:  Pertinent items are noted in HPI.    Objective:  Physical Exam Blood pressure 114/64, pulse 80, height 5\' 2"  (1.575 m), weight 185 lb (83.9 kg), last menstrual period 01/07/2023.  CONSTITUTIONAL: Well-developed, well-nourished female in no acute distress.  HENT:  Normocephalic, atraumatic EYES: Conjunctivae and EOM are normal. No scleral icterus.  NECK: Normal range of motion SKIN: Skin is warm and dry. No rash noted. Not diaphoretic.No pallor. NEUROLGIC: Alert and oriented to person, place, and time. Normal coordination.  Abd: Soft, nontender and nondistended  Area cleaned with alcohol.  1cc of Kenalog 40 + 1 cc Lidocaine 2% was injected into the keloid.  There were no complications.   Labs and Imaging MM 3D DIAGNOSTIC MAMMOGRAM BILATERAL BREAST  Result Date: 01/28/2023 CLINICAL DATA:  32 year old female presents with palpable fullness in the far OUTER RIGHT breast. Baseline mammogram. EXAM: DIGITAL DIAGNOSTIC BILATERAL MAMMOGRAM WITH TOMOSYNTHESIS AND CAD; ULTRASOUND RIGHT BREAST LIMITED TECHNIQUE: Bilateral digital diagnostic mammography and breast tomosynthesis was performed. The images were evaluated with computer-aided detection. ; Targeted ultrasound examination of the right breast was performed COMPARISON:  None available. ACR Breast Density Category c: The breasts are heterogeneously dense, which may obscure small masses. FINDINGS: Full field and spot compression views of both breast demonstrate no suspicious mass, distortion or worrisome calcifications. Targeted  ultrasound is performed, showing no sonographic abnormalities within the OUTER RIGHT breast or UPPER-OUTER RIGHT breast. IMPRESSION: 1. No mammographic or sonographic abnormalities within the OUTER RIGHT breast, in the area of patient concern. 2. No mammographic evidence of breast malignancy. RECOMMENDATION: Recommend clinical follow-up as indicated. Any further workup should be based on clinical grounds. Bilateral screening mammogram at age 38. I have discussed the findings and recommendations with the patient. If applicable, a reminder letter will be sent to the patient regarding the next appointment. BI-RADS CATEGORY  1: Negative. Electronically Signed   By: Harmon Pier M.D.   On: 01/28/2023 13:57   [place] LIMITED ULTRASOUND INCLUDING AXILLA RIGHT BREAST  Result Date: 01/28/2023 CLINICAL DATA:  32 year old female presents with palpable fullness in the far OUTER RIGHT breast. Baseline mammogram. EXAM: DIGITAL DIAGNOSTIC BILATERAL MAMMOGRAM WITH TOMOSYNTHESIS AND CAD; ULTRASOUND RIGHT BREAST LIMITED TECHNIQUE: Bilateral digital diagnostic mammography and breast tomosynthesis was performed. The images were evaluated with computer-aided detection. ; Targeted ultrasound examination of the right breast was performed COMPARISON:  None available. ACR Breast Density Category c: The breasts are heterogeneously dense, which may obscure small masses. FINDINGS: Full field and spot compression views of both breast demonstrate no suspicious mass, distortion or worrisome calcifications. Targeted ultrasound is performed, showing no sonographic abnormalities within the OUTER RIGHT breast or UPPER-OUTER RIGHT breast. IMPRESSION: 1. No mammographic or sonographic abnormalities within the OUTER RIGHT breast, in the area of patient concern. 2. No mammographic evidence of breast malignancy. RECOMMENDATION: Recommend clinical follow-up as indicated. Any further workup should be based on clinical grounds. Bilateral screening mammogram at  age 60. I have discussed the findings and recommendations with the patient. If applicable, a reminder letter will be sent to the patient regarding the next appointment. BI-RADS CATEGORY  1:  Negative. Electronically Signed   By: Harmon Pier M.D.   On: 01/28/2023 13:57    Assessment & Plan:  Keloid- surgical incision  She will f/u in 2 weeks for repeat injection.   Tinley Rought L. Harraway-Smith, M.D., [place]

## 2023-03-03 ENCOUNTER — Ambulatory Visit: Payer: 59 | Admitting: Obstetrics & Gynecology

## 2023-03-03 ENCOUNTER — Encounter: Payer: Self-pay | Admitting: Obstetrics & Gynecology

## 2023-03-03 VITALS — BP 119/72 | HR 82 | Wt 181.0 lb

## 2023-03-03 DIAGNOSIS — L910 Hypertrophic scar: Secondary | ICD-10-CM

## 2023-03-03 NOTE — Progress Notes (Signed)
32 y.o. G0P0000 here today for steroid injection of Keloid. She reports that the itching has resolved and it is much better than prev. Smaller.     The following portions of the patient's history were reviewed and updated as appropriate: allergies, current medications, past family history, past medical history, past social history, past surgical history and problem list.   Review of Systems:  Pertinent items are noted in HPI.    Objective:  Physical Exam BP 119/72   Pulse 82   Wt 181 lb (82.1 kg)   LMP 02/14/2023 (Exact Date)   BMI 33.11 kg/m   CONSTITUTIONAL: Well-developed, well-nourished female in no acute distress.  HENT:  Normocephalic, atraumatic EYES: Conjunctivae and EOM are normal. No scleral icterus.  NECK: Normal range of motion SKIN: Skin is warm and dry. No rash noted. Not diaphoretic.No pallor. NEUROLGIC: Alert and oriented to person, place, and time. Normal coordination.  Abd: Soft, nontender and nondistended   Area cleaned with alcohol.  1cc of Kenalog 40 + 1 cc Lidocaine 2% was injected into the keloid.  There were no complications.        Assessment & Plan:  Keloid- surgical incision   She will f/u in 2 weeks for repeat injection. Pt notified that Dr. Macon Large will be here. I'll see her back 2 weeks after. I suspect she will only need ~2 more injections.   Mazel Villela L. Harraway-Smith, M.D., [place]

## 2023-03-17 ENCOUNTER — Ambulatory Visit: Payer: 59 | Admitting: Obstetrics & Gynecology

## 2023-03-17 ENCOUNTER — Encounter: Payer: Self-pay | Admitting: Obstetrics & Gynecology

## 2023-03-17 VITALS — BP 116/68 | HR 75 | Ht 62.0 in | Wt 181.0 lb

## 2023-03-17 DIAGNOSIS — L910 Hypertrophic scar: Secondary | ICD-10-CM | POA: Diagnosis not present

## 2023-03-17 NOTE — Progress Notes (Signed)
   GYNECOLOGY OFFICE VISIT NOTE  History:   Rose Berry is a 32 y.o. G0P0000 here today for repeat Kenalog injection of keloid scar. She denies any abnormal vaginal discharge, bleeding, pelvic pain or other concerns.    Past Medical History:  Diagnosis Date   Anemia    Asthma    Family history of adverse reaction to anesthesia    grandmother N/V   UTI (urinary tract infection)     Past Surgical History:  Procedure Laterality Date   mouth procedure     pt reports having her mouth worked on years ago, unsure of what   MYOMECTOMY N/A 03/03/2022   Procedure: ABDOMINAL MYOMECTOMY;  Surgeon: Willodean Rosenthal, MD;  Location: [facility] OR;  Service: Gynecology;  Laterality: N/A;    The following portions of the patient's history were reviewed and updated as appropriate: allergies, current medications, past family history, past medical history, past social history, past surgical history and problem list.   Health Maintenance:  Normal pap and negative HRHPV on 02/04/2022  Review of Systems:  Pertinent items noted in HPI and remainder of comprehensive ROS otherwise negative.  Physical Exam:  BP 116/68   Pulse 75   Ht 5\' 2"  (1.575 m)   Wt 181 lb (82.1 kg)   LMP 02/14/2023 (Exact Date)   BMI 33.11 kg/m  CONSTITUTIONAL: Well-developed, well-nourished female in no acute distress.  MUSCULOSKELETAL: Normal range of motion. No edema noted. NEUROLOGIC: Alert and oriented to person, place, and time. Normal muscle tone coordination. No cranial nerve deficit noted. PSYCHIATRIC: Normal mood and affect. Normal behavior. Normal judgment and thought content. CARDIOVASCULAR: Normal heart rate noted RESPIRATORY: Effort and breath sounds normal, no problems with respiration noted ABDOMEN: Keloid scar noted over Pfannenstiel incision. No masses noted. No other overt distention noted.   PELVIC: Deferred  Procedure details Keloid cleaned with alcohol.  1cc of Kenalog 40 + 1 cc Lidocaine 2% was  injected into the keloid.  There were no complications.     Assessment and Plan:     1. Keloid of skin Doing well. Return in 2 weeks for next injection.  Please refer to After Visit Summary for other counseling recommendations.   Return in about 2 weeks (around 03/31/2023) for Injection into keloid .    I spent 15 minutes dedicated to the care of this patient including pre-visit review of records, face to face time with the patient discussing her conditions and treatments and post visit orders.    Jaynie Collins, MD, FACOG Obstetrician & Gynecologist, [facility] for [employer], [facility]

## 2023-04-02 ENCOUNTER — Encounter: Payer: Self-pay | Admitting: Obstetrics and Gynecology

## 2023-04-02 ENCOUNTER — Ambulatory Visit (INDEPENDENT_AMBULATORY_CARE_PROVIDER_SITE_OTHER): Payer: 59 | Admitting: Obstetrics and Gynecology

## 2023-04-02 VITALS — BP 122/74 | HR 79 | Ht 62.0 in | Wt 181.0 lb

## 2023-04-02 DIAGNOSIS — L910 Hypertrophic scar: Secondary | ICD-10-CM

## 2023-04-02 NOTE — Progress Notes (Signed)
    GYNECOLOGY VISIT  Patient name: Rose Berry MRN [id number]  Date of birth: [birth year] Chief Complaint:   Procedure  History:  Rose Berry is a 32 y.o. G0P0000 being seen today for keloid scar injection. No changes since last visit. Has seen some improvement.     Review of Systems:  Pertinent items are noted in HPI. Comprehensive review of systems was otherwise negative.   Objective:  Physical Exam BP 122/74   Pulse 79   Ht 5\' 2"  (1.575 m)   Wt 181 lb (82.1 kg)   LMP 02/14/2023 (Exact Date)   BMI 33.11 kg/m    Physical Exam Vitals and nursing note reviewed.  Constitutional:      Appearance: Normal appearance.  HENT:     Head: Normocephalic and atraumatic.  Pulmonary:     Effort: Pulmonary effort is normal.  Abdominal:     Comments: Low transverse keloid incision  Skin:    General: Skin is warm and dry.  Neurological:     General: No focal deficit present.     Mental Status: She is alert.  Psychiatric:        Mood and Affect: Mood normal.        Behavior: Behavior normal.        Thought Content: Thought content normal.        Judgment: Judgment normal.     Procedure details Keloid cleaned with alcohol.  1cc of Kenalog 40 + 1 cc Lidocaine 2% was injected into the keloid.  There were no complications.      Assessment & Plan:   1. Keloid of skin Now s/p 6th injection  Routine preventative health maintenance measures emphasized.  Lorriane Shire, MD [facility] for [facility], [facility]

## 2023-07-20 ENCOUNTER — Ambulatory Visit: Admitting: Medical

## 2023-07-20 ENCOUNTER — Encounter: Payer: Self-pay | Admitting: Medical

## 2023-07-20 VITALS — BP 110/76 | HR 86 | Temp 98.0°F | Resp 18 | Ht 62.0 in | Wt 182.0 lb

## 2023-07-20 DIAGNOSIS — B349 Viral infection, unspecified: Secondary | ICD-10-CM

## 2023-07-20 DIAGNOSIS — R0609 Other forms of dyspnea: Secondary | ICD-10-CM

## 2023-07-20 DIAGNOSIS — R6883 Chills (without fever): Secondary | ICD-10-CM

## 2023-07-20 DIAGNOSIS — R5383 Other fatigue: Secondary | ICD-10-CM | POA: Diagnosis not present

## 2023-07-20 DIAGNOSIS — R0789 Other chest pain: Secondary | ICD-10-CM

## 2023-07-20 LAB — CBC WITH DIFFERENTIAL/PLATELET
Basophils Absolute: 0 10*3/uL (ref 0.0–0.1)
Basophils Relative: 0.5 % (ref 0.0–3.0)
Eosinophils Absolute: 0.1 10*3/uL (ref 0.0–0.7)
Eosinophils Relative: 0.8 % (ref 0.0–5.0)
HCT: 44.1 % (ref 36.0–46.0)
Hemoglobin: 14.5 g/dL (ref 12.0–15.0)
Lymphocytes Relative: 26.8 % (ref 12.0–46.0)
Lymphs Abs: 1.8 10*3/uL (ref 0.7–4.0)
MCHC: 32.8 g/dL (ref 30.0–36.0)
MCV: 87.5 fl (ref 78.0–100.0)
Monocytes Absolute: 0.6 10*3/uL (ref 0.1–1.0)
Monocytes Relative: 8.8 % (ref 3.0–12.0)
Neutro Abs: 4.2 10*3/uL (ref 1.4–7.7)
Neutrophils Relative %: 63.1 % (ref 43.0–77.0)
Platelets: 281 10*3/uL (ref 150.0–400.0)
RBC: 5.04 Mil/uL (ref 3.87–5.11)
RDW: 12.6 % (ref 11.5–15.5)
WBC: 6.7 10*3/uL (ref 4.0–10.5)

## 2023-07-20 LAB — COMPREHENSIVE METABOLIC PANEL
ALT: 36 U/L — ABNORMAL HIGH (ref 0–35)
AST: 27 U/L (ref 0–37)
Albumin: 4.4 g/dL (ref 3.5–5.2)
Alkaline Phosphatase: 74 U/L (ref 39–117)
BUN: 11 mg/dL (ref 6–23)
CO2: 30 meq/L (ref 19–32)
Calcium: 9.4 mg/dL (ref 8.4–10.5)
Chloride: 102 meq/L (ref 96–112)
Creatinine, Ser: 0.85 mg/dL (ref 0.40–1.20)
GFR: 89.55 mL/min (ref >60.00)
Glucose, Bld: 89 mg/dL (ref 70–99)
Potassium: 4.3 meq/L (ref 3.5–5.1)
Sodium: 137 meq/L (ref 135–145)
Total Bilirubin: 0.8 mg/dL (ref 0.2–1.2)
Total Protein: 7.6 g/dL (ref 6.0–8.3)

## 2023-07-20 LAB — TROPONIN I (HIGH SENSITIVITY): High Sens Troponin I: 3 ng/L (ref 2–17)

## 2023-07-20 LAB — POC COVID19 BINAXNOW: SARS Coronavirus 2 Ag: NEGATIVE

## 2023-07-20 LAB — POCT INFLUENZA A/B
Influenza A, POC: NEGATIVE
Influenza B, POC: NEGATIVE

## 2023-07-20 NOTE — Progress Notes (Addendum)
 Subjective:    Patient ID: Rose Berry, female    DOB: 10-Oct-1990, 33 y.o.   MRN: [id number]  HPI Rose Berry is a 33 year old female who presents with chills and chest pain.  She experienced chills while at work yesterday, followed by the onset of chest pain last night. The chest pain is described as a heaviness that lasted only one minute, similar to the sensation after coughing a lot. Again it  lasted for about a minute and occurred while she was sitting up. No associated jaw, shoulder, or arm pain, and no shortness of breath at the time of the chest pain.  This morning, she woke up feeling worse than yesterday, experiencing shortness of breath and overall fatigue. No muscle aches or body aches, and no history of asthma. She reports some coughing, though not constant, and denies nasal congestion.  Both flu and covid test negative. She has no st. No myalgias. No uti sign.symptoms.     Lmp Jun 24, 2023.  Review of Systems  Constitutional:  Positive for chills and fatigue.  HENT:  Negative for congestion, ear pain and mouth sores.   Respiratory:  Positive for cough and shortness of breath. Negative for chest tightness and wheezing.   Cardiovascular:  Negative for palpitations.  Musculoskeletal:  Negative for myalgias.  Neurological:        Left pinkey tingling for one and half or 2 weeks. Constant. Never wakes up in middle night with hand or forearm. No injury to pinkey. No neck pain. Pt works on An[employer].  No correlation of pinkey pain or chest pain in the past.  Hematological:  Negative for adenopathy. Does not bruise/bleed easily.  Psychiatric/Behavioral:  Negative for behavioral problems and decreased concentration.     Past Medical History:  Diagnosis Date   Anemia    Asthma    Family history of adverse reaction to anesthesia    grandmother N/V   UTI (urinary tract infection)      Social History   Socioeconomic History   Marital status: Single    Spouse  name: Not on file   Number of children: Not on file   Years of education: Not on file   Highest education level: Some college, no degree  Occupational History   Not on file  Tobacco Use   Smoking status: Never   Smokeless tobacco: Never  Vaping Use   Vaping status: Never Used  Substance and Sexual Activity   Alcohol use: No   Drug use: No   Sexual activity: Not Currently    Birth control/protection: Condom  Other Topics Concern   Not on file  Social History Narrative   Not on file   Social Drivers of Health   Financial Resource Strain: Low Risk  (01/20/2023)   Overall Financial Resource Strain (CARDIA)    Difficulty of Paying Living Expenses: Not very hard  Food Insecurity: No Food Insecurity (01/20/2023)   Hunger Vital Sign    Worried About Running Out of Food in the Last Year: Never true    Ran Out of Food in the Last Year: Never true  Transportation Needs: No Transportation Needs (01/20/2023)   PRAPARE - Administrator, Civil Service (Medical): No    Lack of Transportation (Non-Medical): No  Physical Activity: Sufficiently Active (01/20/2023)   Exercise Vital Sign    Days of Exercise per Week: 5 days    Minutes of Exercise per Session: 30 min  Stress: Stress Concern Present (01/20/2023)  [employer] of Occupational Health - Occupational Stress Questionnaire    Feeling of Stress : To some extent  Social Connections: Moderately Integrated (01/20/2023)   Social Connection and Isolation Panel [NHANES]    Frequency of Communication with Friends and Family: Three times a week    Frequency of Social Gatherings with Friends and Family: Once a week    Attends Religious Services: More than 4 times per year    Active Member of [employer] or Organizations: Yes    Attends Engineer, structural: More than 4 times per year    Marital Status: Never married  Intimate Partner Violence: Not At Risk (11/14/2022)   Received from [facility]   HITS    Over the last 12  months how often did your partner physically hurt you?: Never    Over the last 12 months how often did your partner insult you or talk down to you?: Never    Over the last 12 months how often did your partner threaten you with physical harm?: Never    Over the last 12 months how often did your partner scream or curse at you?: Never    Past Surgical History:  Procedure Laterality Date   mouth procedure     pt reports having her mouth worked on years ago, unsure of what   MYOMECTOMY N/A 03/03/2022   Procedure: ABDOMINAL MYOMECTOMY;  Surgeon: Willodean Rosenthal, MD;  Location: [facility] OR;  Service: Gynecology;  Laterality: N/A;    Family History  Problem Relation Age of Onset   Arthritis Mother    Hypertension Mother    Hyperlipidemia Maternal Grandmother    Hypertension Maternal Grandmother    Diabetes Maternal Grandmother    Kidney disease Maternal Grandmother    Breast cancer Maternal Grandmother    Hyperlipidemia Maternal Grandfather    Hypertension Maternal Grandfather    Diabetes Maternal Grandfather    Kidney disease Maternal Grandfather    Hyperlipidemia Paternal Grandmother    Hypertension Paternal Grandmother    Diabetes Paternal Grandmother    Kidney disease Paternal Grandmother    Hyperlipidemia Paternal Grandfather    Hypertension Paternal Grandfather    Diabetes Paternal Grandfather    Kidney disease Paternal Grandfather     No Known Allergies  Current Outpatient Medications on File Prior to Visit  Medication Sig Dispense Refill   butalbital-apap-caffeine-codeine (FIORICET WITH CODEINE) 50-325-40-30 MG capsule Take 1 capsule by mouth every 6 (six) hours as needed for headache. (Patient not taking: Reported on 03/18/2022) 8 capsule 0   diclofenac (VOLTAREN) 75 MG EC tablet Take 1 tablet (75 mg total) by mouth 3 (three) times daily. (Patient not taking: Reported on 01/20/2023) 30 tablet 1   docusate sodium (COLACE) 100 MG capsule Take 1 capsule (100 mg total) by  mouth 2 (two) times daily. (Patient not taking: Reported on 03/18/2022) 10 capsule 0   Fe Fum-FePoly-FA-Vit C-Vit B3 (INTEGRA F) 125-1 MG CAPS Take 1 capsule by mouth daily at 8 pm. (Patient not taking: Reported on 01/20/2023) 30 capsule 3   Multiple Vitamin (MULTIVITAMIN) capsule Take 1 capsule by mouth daily.     traMADol (ULTRAM) 50 MG tablet Take 1 tablet (50 mg total) by mouth every 6 (six) hours as needed for severe pain. (Patient not taking: Reported on 01/20/2023) 40 tablet 0   No current facility-administered medications on file prior to visit.    BP 110/76   Pulse 86   Temp 98 F (36.7 C)   Resp 18  Ht 5\' 2"  (1.575 m)   Wt 182 lb (82.6 kg)   LMP 06/24/2023   SpO2 100%   BMI 33.29 kg/m        Objective:   Physical Exam  General Mental Status- Alert. General Appearance- Not in acute distress.   Skin General: Color- Normal Color. Moisture- Normal Moisture.  Neck No JVD. No tracheal deviation.  Chest and Lung Exam Auscultation: Breath Sounds:-CTA  Cardiovascular Auscultation:Rythm- RRR Murmurs & Other Heart Sounds:Auscultation of the heart reveals- No Murmurs.  Abdomen Inspection:-Inspection Normal. Palpation/Percussion:Note:No mass. Palpation and Percussion of the abdomen reveal- Non Tender, Non Distended + BS, no rebound or guarding.   Neurologic Cranial Nerve exam:- CN III-XII intact(No nystagmus), symmetric smile. Strength:- 5/5 equal and symmetric strength both upper and lower extremities.   Lower ext- Bilaterally no pedal edama, calfs symetric, negative homans signs   Atnerior thorax- no costochondral junction tenderness to palpation.   Heent- no sinus pressure. Ears- canals clear and normal tms. Postrioer pharynx. Normal.      Assessment & Plan:   Assessment and Plan    Patient Instructions  Early viral syndrome Suspected early viral syndrome with negative COVID-19 and influenza tests. EKG normal. Advised that early tests can be negative  and to repeat tests if symptoms worsen or change. Discussed antiviral treatment for influenza and COVID-19 testing timeline. - Advise rest and hydration. - Prescribe acetaminophen for fever. - Order CBC and metabolic panel. - Perform cardiac protein study. - Provide work note for absence until Friday. - Instruct to repeat COVID-19 and influenza tests if symptoms worsen or change. - Place standing order for chest x-ray if symptoms worsening dyspnea.  Atyical chest pain Transient chest heaviness likely related to early viral syndrome. EKG nsr. No ischemia.   Follow up 7-10 days or sooner if needed   [employer], PA-C

## 2023-07-20 NOTE — Patient Instructions (Addendum)
 Early viral syndrome Suspected early viral syndrome with negative COVID-19 and influenza tests. EKG normal. Advised that early tests can be negative and to repeat tests if symptoms worsen or change. Discussed antiviral treatment for influenza and COVID-19 testing timeline. - Advise rest and hydration. - Prescribe acetaminophen for fever. - Order CBC and metabolic panel. - Perform cardiac protein study. - Provide work note for absence until Friday. - Instruct to repeat COVID-19 and influenza tests if symptoms worsen or change. - Place standing order for chest x-ray if symptoms worsening dyspnea. -if signs/symptoms change or worsen let us know.  Atyical chest pain Transient chest heaviness likely related to early viral syndrome. EKG nsr. No ischemia.   Follow up 7-10 days or sooner if needed

## 2024-03-27 ENCOUNTER — Ambulatory Visit: Admitting: Obstetrics & Gynecology

## 2024-03-27 ENCOUNTER — Ambulatory Visit (HOSPITAL_BASED_OUTPATIENT_CLINIC_OR_DEPARTMENT_OTHER)

## 2024-03-27 VITALS — BP 113/72 | HR 72 | Ht 62.0 in | Wt 191.1 lb

## 2024-03-27 DIAGNOSIS — R102 Pelvic and perineal pain unspecified side: Secondary | ICD-10-CM

## 2024-03-27 DIAGNOSIS — D219 Benign neoplasm of connective and other soft tissue, unspecified: Secondary | ICD-10-CM

## 2024-03-27 LAB — POCT URINALYSIS DIPSTICK
Blood, UA: NEGATIVE
Glucose, UA: NEGATIVE
Leukocytes, UA: NEGATIVE
Nitrite, UA: 0.02
Protein, UA: NEGATIVE
Spec Grav, UA: 1.015 (ref 1.010–1.025)
Urobilinogen, UA: NEGATIVE U/dL — AB
pH, UA: 6.5 (ref 5.0–8.0)

## 2024-03-27 NOTE — Progress Notes (Unsigned)
 Patient ID: Rose Berry, female   DOB: [birth year], 33 y.o.   MRN: [id number]  Chief Complaint  Patient presents with   Follow-up    Right pain that feels like a tear where her incisions are. Making pt uncomfortable     HPI Rose Berry is a 33 y.o. female.  G0P0000 Patient's last menstrual period was 03/09/2024 (exact date). She has abdominal myomectomy 2 yr ago and for the last few months she has had a pulling, tearing pain in RLQ with pain that is not severe or long-lasting. She had more dysmenorrhea with her last period. Naproxen helped with her last period. HPI  Past Medical History:  Diagnosis Date   Anemia    Asthma    Family history of adverse reaction to anesthesia    grandmother N/V   UTI (urinary tract infection)     Past Surgical History:  Procedure Laterality Date   mouth procedure     pt reports having her mouth worked on years ago, unsure of what   MYOMECTOMY N/A 03/03/2022   Procedure: ABDOMINAL MYOMECTOMY;  Surgeon: Corene Coy, MD;  Location: [facility] OR;  Service: Gynecology;  Laterality: N/A;    Family History  Problem Relation Age of Onset   Arthritis Mother    Hypertension Mother    Hyperlipidemia Maternal Grandmother    Hypertension Maternal Grandmother    Diabetes Maternal Grandmother    Kidney disease Maternal Grandmother    Breast cancer Maternal Grandmother    Hyperlipidemia Maternal Grandfather    Hypertension Maternal Grandfather    Diabetes Maternal Grandfather    Kidney disease Maternal Grandfather    Hyperlipidemia Paternal Grandmother    Hypertension Paternal Grandmother    Diabetes Paternal Grandmother    Kidney disease Paternal Grandmother    Hyperlipidemia Paternal Grandfather    Hypertension Paternal Grandfather    Diabetes Paternal Grandfather    Kidney disease Paternal Grandfather     Social History Social History   Tobacco Use   Smoking status: Never   Smokeless tobacco: Never  Vaping Use   Vaping  status: Never Used  Substance Use Topics   Alcohol use: No   Drug use: No    No Known Allergies  Current Outpatient Medications  Medication Sig Dispense Refill   Multiple Vitamin (MULTIVITAMIN) capsule Take 1 capsule by mouth daily.     butalbital -apap-caffeine-codeine (FIORICET WITH CODEINE) 50-325-40-30 MG capsule Take 1 capsule by mouth every 6 (six) hours as needed for headache. (Patient not taking: Reported on 03/18/2022) 8 capsule 0   diclofenac  (VOLTAREN ) 75 MG EC tablet Take 1 tablet (75 mg total) by mouth 3 (three) times daily. (Patient not taking: Reported on 01/20/2023) 30 tablet 1   docusate sodium  (COLACE) 100 MG capsule Take 1 capsule (100 mg total) by mouth 2 (two) times daily. (Patient not taking: Reported on 03/18/2022) 10 capsule 0   Fe Fum-FePoly-FA-Vit C-Vit B3 (INTEGRA F ) 125-1 MG CAPS Take 1 capsule by mouth daily at 8 pm. (Patient not taking: Reported on 01/20/2023) 30 capsule 3   traMADol  (ULTRAM ) 50 MG tablet Take 1 tablet (50 mg total) by mouth every 6 (six) hours as needed for severe pain. (Patient not taking: Reported on 01/20/2023) 40 tablet 0   No current facility-administered medications for this visit.    Review of Systems Review of Systems  Constitutional: Negative.   Gastrointestinal: Negative.   Genitourinary:  Positive for menstrual problem and pelvic pain. Negative for dysuria, frequency, vaginal bleeding and vaginal discharge.  Blood pressure 113/72, pulse 72, height 5' 2 (1.575 m), weight 191 lb 1.9 oz (86.7 kg), last menstrual period 03/09/2024.  Physical Exam Physical Exam Vitals and nursing note reviewed. Exam conducted with a chaperone present.  Constitutional:      Appearance: Normal appearance.  Pulmonary:     Effort: Pulmonary effort is normal.  Abdominal:     General: Abdomen is flat.     Palpations: Abdomen is soft. There is no mass.     Tenderness: There is no abdominal tenderness. There is no guarding.     Hernia: No hernia is  present.  Neurological:     Mental Status: She is alert.  Psychiatric:        Mood and Affect: Mood normal.        Behavior: Behavior normal.     Data Reviewed US  report  Assessment Fibroid - Plan: US  PELVIC COMPLETE WITH TRANSVAGINAL  Pelvic pain S/p myomectomy. Incision looks good   Plan Orders Placed This Encounter  Procedures   US  PELVIC COMPLETE WITH TRANSVAGINAL    Standing Status:   Future    Expected Date:   04/10/2024    Expiration Date:   03/27/2025    Reason for Exam (SYMPTOM  OR DIAGNOSIS REQUIRED):   RLQ pain, fibroid    Preferred imaging location?:   [facility]   RTC for f/u after US     Rose Berry 03/27/2024, 9:20 AM

## 2024-03-31 ENCOUNTER — Ambulatory Visit (HOSPITAL_BASED_OUTPATIENT_CLINIC_OR_DEPARTMENT_OTHER): Admission: RE | Admit: 2024-03-31

## 2024-04-03 ENCOUNTER — Ambulatory Visit (HOSPITAL_BASED_OUTPATIENT_CLINIC_OR_DEPARTMENT_OTHER)
Admission: RE | Admit: 2024-04-03 | Discharge: 2024-04-03 | Attending: Obstetrics & Gynecology | Admitting: Obstetrics & Gynecology

## 2024-04-03 DIAGNOSIS — D219 Benign neoplasm of connective and other soft tissue, unspecified: Secondary | ICD-10-CM

## 2024-04-12 ENCOUNTER — Ambulatory Visit: Payer: Self-pay | Admitting: Obstetrics & Gynecology

## 2024-05-08 ENCOUNTER — Ambulatory Visit: Admitting: Obstetrics and Gynecology

## 2024-05-25 ENCOUNTER — Ambulatory Visit: Admitting: Obstetrics & Gynecology

## 2024-05-25 VITALS — BP 123/82 | HR 76 | Ht 62.0 in | Wt 194.0 lb

## 2024-05-25 DIAGNOSIS — N83201 Unspecified ovarian cyst, right side: Secondary | ICD-10-CM

## 2024-05-25 DIAGNOSIS — N920 Excessive and frequent menstruation with regular cycle: Secondary | ICD-10-CM

## 2024-05-25 DIAGNOSIS — D219 Benign neoplasm of connective and other soft tissue, unspecified: Secondary | ICD-10-CM

## 2024-05-25 NOTE — Progress Notes (Signed)
 Patient ID: Rose Berry, female   DOB: 1990-11-28, 34 y.o.   MRN: [id number]  Chief Complaint  Patient presents with   Follow-up    Follow up from US      HPI Rose Berry is a 34 y.o. female.  G0P0000 Patient's last menstrual period was 05/18/2024 (exact date). She recently had a heavy period. US  showed several fibroids, ovarian cyst and hydrosalpinx. HPI h/o myomectomy 2023  Past Medical History:  Diagnosis Date   Anemia    Asthma    Family history of adverse reaction to anesthesia    grandmother N/V   UTI (urinary tract infection)     Past Surgical History:  Procedure Laterality Date   mouth procedure     pt reports having her mouth worked on years ago, unsure of what   MYOMECTOMY N/A 03/03/2022   Procedure: ABDOMINAL MYOMECTOMY;  Surgeon: Corene Coy, MD;  Location: [facility] OR;  Service: Gynecology;  Laterality: N/A;    Family History  Problem Relation Age of Onset   Arthritis Mother    Hypertension Mother    Hyperlipidemia Maternal Grandmother    Hypertension Maternal Grandmother    Diabetes Maternal Grandmother    Kidney disease Maternal Grandmother    Breast cancer Maternal Grandmother    Hyperlipidemia Maternal Grandfather    Hypertension Maternal Grandfather    Diabetes Maternal Grandfather    Kidney disease Maternal Grandfather    Hyperlipidemia Paternal Grandmother    Hypertension Paternal Grandmother    Diabetes Paternal Grandmother    Kidney disease Paternal Grandmother    Hyperlipidemia Paternal Grandfather    Hypertension Paternal Grandfather    Diabetes Paternal Grandfather    Kidney disease Paternal Grandfather     Social History Social History[1]  Allergies[2]  Current Outpatient Medications  Medication Sig Dispense Refill   Multiple Vitamin (MULTIVITAMIN) capsule Take 1 capsule by mouth daily.     butalbital -apap-caffeine-codeine (FIORICET WITH CODEINE) 50-325-40-30 MG capsule Take 1 capsule by mouth every 6 (six) hours as  needed for headache. (Patient not taking: Reported on 03/18/2022) 8 capsule 0   diclofenac  (VOLTAREN ) 75 MG EC tablet Take 1 tablet (75 mg total) by mouth 3 (three) times daily. (Patient not taking: Reported on 01/20/2023) 30 tablet 1   docusate sodium  (COLACE) 100 MG capsule Take 1 capsule (100 mg total) by mouth 2 (two) times daily. (Patient not taking: Reported on 03/18/2022) 10 capsule 0   Fe Fum-FePoly-FA-Vit C-Vit B3 (INTEGRA F ) 125-1 MG CAPS Take 1 capsule by mouth daily at 8 pm. (Patient not taking: Reported on 01/20/2023) 30 capsule 3   traMADol  (ULTRAM ) 50 MG tablet Take 1 tablet (50 mg total) by mouth every 6 (six) hours as needed for severe pain. (Patient not taking: Reported on 01/20/2023) 40 tablet 0   No current facility-administered medications for this visit.    Review of Systems Review of Systems  Constitutional: Negative.   Respiratory: Negative.    Cardiovascular: Negative.   Gastrointestinal: Negative.   Genitourinary:  Positive for menstrual problem and pelvic pain. Negative for vaginal bleeding and vaginal discharge.    Blood pressure 123/82, pulse 76, height 5' 2 (1.575 m), weight 194 lb (88 kg), last menstrual period 05/18/2024.  Physical Exam Physical Exam Vitals and nursing note reviewed.  Constitutional:      Appearance: Normal appearance.  Cardiovascular:     Rate and Rhythm: Normal rate.  Pulmonary:     Effort: Pulmonary effort is normal.  Neurological:     General: No focal  deficit present.     Mental Status: She is alert.  Psychiatric:        Mood and Affect: Mood normal.        Behavior: Behavior normal.     Data Reviewed Narrative & Impression  EXAM: US  Pelvis, Complete Transvaginal and Transabdominal without Doppler   TECHNIQUE: Transabdominal and transvaginal pelvic duplex ultrasound using B-mode/gray scaled imaging without Doppler spectral analysis and color flow was obtained.   COMPARISON: US  Transvaginal Pelvis 10/18/2018.    CLINICAL HISTORY: RLQ pain, fibroid.   FINDINGS:   UTERUS: Uterus measures 10 x 4.8 x 5.3 cm. Numerous uterine fibroids are seen throughout the uterus. Fundal fibroid measures 2.7 x 1.8 x 2.3 cm. Posterior fibroid measures 2.3 x 1.4 x 1.8 cm.   ENDOMETRIAL STRIPE: Endometrial stripe measures 1.6 cm. The endometrium is ill defined / not well delineated. It measures between 8 and 15 mm.   RIGHT OVARY: Right ovary measures 4.8 x 3.0 x 3.5 cm. There is a complex cyst in the right ovary measuring 2.4 x 1.3 x 2.1 cm. It is difficult to document vascular flow in the right ovary likely secondary to posterior location in the pelvis.   LEFT OVARY: Left ovary measures 7.2 x 5.1 x 5.8 cm. Multiple complex cysts are seen within the left ovary. The largest measures 3.6 x 2.7 x 3.0 cm. There is normal arterial and venous Doppler flow.   RIGHT ADNEXA: Within the right adnexa, there is a complex cystic area measuring 4.1 x 2.8 x 2.6 cm, only seen on transabdominal imaging. This contains some septations and internal echoes. This may be tubular in nature.   FREE FLUID: There is trace free fluid in the pelvis.   IMPRESSION: 1. Numerous uterine fibroids, including a fundal fibroid measuring 2.7 x 1.8 x 2.3 cm and a posterior fibroid measuring 2.3 x 1.4 x 1.8 cm. 2. Ill-defined endometrium measuring between 8 and 15 mm. 3. Multiple complex cysts in the left ovary, the largest measuring 3.6 x 2.7 x 3.0 cm. 4. Complex cyst in the right ovary measuring 2.4 x 1.3 x 2.1 cm. 5. Complex cystic area in the right adnexa measuring 4.1 x 2.8 x 2.6 cm with septations and internal echoes, possibly tubular in nature. This may represent hydrosalpinx. 6. Trace free fluid in the pelvis. 7. Given the above constellation of findings, a short-term follow-up pelvic ultrasound is recommended in 2 months or 2 menstrual cycles, or follow-up pelvic MRI.   Electronically signed by: Greig Pique MD 04/08/2024 10:35  PM EST RP Workstation: [id number]        Assessment Sx uterine fibroids and ovarian cysts s/p myomectomy   Plan She is glad the fibroids are not larger but agrees with the recommendation to have repeat US  RTC to review  Orders Placed This Encounter  Procedures   US  PELVIC COMPLETE WITH TRANSVAGINAL    Standing Status:   Future    Expected Date:   06/08/2024    Expiration Date:   05/25/2025    Reason for Exam (SYMPTOM  OR DIAGNOSIS REQUIRED):   fibroids and cyst f/u    Preferred imaging location?:   [facility]      Lynwood Solomons 05/25/2024, 2:21 PM       [1]  Social History Tobacco Use   Smoking status: Never   Smokeless tobacco: Never  Vaping Use   Vaping status: Never Used  Substance Use Topics   Alcohol use: No   Drug use: No  [2]  No Known Allergies

## 2024-06-16 ENCOUNTER — Ambulatory Visit (HOSPITAL_BASED_OUTPATIENT_CLINIC_OR_DEPARTMENT_OTHER): Admission: RE | Admit: 2024-06-16 | Source: Ambulatory Visit
